# Patient Record
Sex: Male | Born: 1995 | Race: White | Hispanic: No | Marital: Single | Smoking: Former smoker
Health system: Southern US, Community
[De-identification: ages and names within clinical notes are randomized; demographics above are authoritative.]

## PROBLEM LIST (undated history)

## (undated) DIAGNOSIS — F909 Attention-deficit hyperactivity disorder, unspecified type: Secondary | ICD-10-CM

## (undated) DIAGNOSIS — E162 Hypoglycemia, unspecified: Secondary | ICD-10-CM

## (undated) DIAGNOSIS — E860 Dehydration: Secondary | ICD-10-CM

## (undated) DIAGNOSIS — T8859XA Other complications of anesthesia, initial encounter: Secondary | ICD-10-CM

## (undated) DIAGNOSIS — E049 Nontoxic goiter, unspecified: Secondary | ICD-10-CM

## (undated) DIAGNOSIS — R519 Headache, unspecified: Secondary | ICD-10-CM

## (undated) DIAGNOSIS — E109 Type 1 diabetes mellitus without complications: Secondary | ICD-10-CM

## (undated) HISTORY — DX: Type 1 diabetes mellitus without complications: E10.9

## (undated) HISTORY — DX: Hypoglycemia, unspecified: E16.2

## (undated) HISTORY — PX: WRIST FRACTURE SURGERY: SHX121

## (undated) HISTORY — DX: Dehydration: E86.0

## (undated) HISTORY — DX: Nontoxic goiter, unspecified: E04.9

## (undated) HISTORY — PX: TYMPANOSTOMY TUBE PLACEMENT: SHX32

---
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
## 2016-03-08 ENCOUNTER — Encounter: Payer: Self-pay | Admitting: Internal Medicine

## 2016-04-09 ENCOUNTER — Encounter: Payer: Self-pay | Admitting: Endocrinology

## 2016-04-09 ENCOUNTER — Ambulatory Visit (INDEPENDENT_AMBULATORY_CARE_PROVIDER_SITE_OTHER): Payer: BLUE CROSS/BLUE SHIELD | Admitting: Endocrinology

## 2016-04-09 VITALS — BP 124/74 | HR 63 | Wt 135.6 lb

## 2016-04-09 DIAGNOSIS — E108 Type 1 diabetes mellitus with unspecified complications: Secondary | ICD-10-CM

## 2016-04-09 LAB — POCT GLYCOSYLATED HEMOGLOBIN (HGB A1C): HEMOGLOBIN A1C: 13.7

## 2016-04-09 MED ORDER — INSULIN GLARGINE 100 UNIT/ML SOLOSTAR PEN: 24.0000 [IU] | PEN_INJECTOR | Freq: Every day | SUBCUTANEOUS | 99 refills | Status: DC

## 2016-04-09 MED ORDER — INSULIN ASPART 100 UNIT/ML FLEXPEN: PEN_INJECTOR | SUBCUTANEOUS | 11 refills | Status: DC

## 2016-04-09 NOTE — Patient Instructions (Addendum)
good diet and exercise significantly improve the control of your diabetes.  please let me know if you wish to be referred to a dietician.  high blood sugar is very risky to your health.  you should see an eye doctor and dentist every year.  It is very important to get all recommended vaccinations.  Controlling your blood pressure and cholesterol drastically reduces the damage diabetes does to your body.  Those who smoke should quit.  Please discuss these with your doctor.  check your blood sugar 4 times a day: before the 3 meals, and at bedtime.  also check if you have symptoms of your blood sugar being too high or too low.  please keep a record of the readings and bring it to your next appointment here (or you can bring the meter itself).  You can write it on any piece of paper.  please call us sooner if your blood sugar goes below 70, or if you have a lot of readings over 200.  For now, please: Increase the lantus to 24 units daily, and: Decrease the novolog to 1 unit for each 20 grams (except 1 unit for each 30 grams at lunch), and: Continue 1 unit for each 50 over 150 that your blood sugar is. Please come back for a follow-up appointment in 6 weeks.

## 2016-04-09 NOTE — Progress Notes (Signed)
Subjective:    Patient ID: Phillip [name], male    DOB: 03/14/1996, 20 y.o.   MRN: [id number]  HPI pt is referred by Dr Dario GuardianPudlo, for diabetes.  Pt states DM was dx'ed in 2013; he has mild if any neuropathy of the lower extremities; he is unaware of any associated chronic complications; he has been on insulin since dx; pt says his diet and exercise are good; he has never had pancreatitis or severe hypoglycemia.  He has had only 1 episode of DKA (at time of dx).  He took pump rx from 2014-2017 (he stopped due to "scar tissue" at the infusion sites).  He now takes multiple daily injections: lantus 17 units qhs, and qac novolog (7-10 units/meal: 1 unit/15 grams CHO, and 1 unit for for each 50 by which cbg exceeds 150).  He says he misses approx 1 dose qod.  He says cbg's vary from 140-230.  It is highest at hs, and lowest in the afternoon.  He has glucagon, but he has never had to use.   Past Medical History:  Diagnosis Date  . ADHD (attention deficit hyperactivity disorder)   . Adjustment reaction   . Dehydration   . Diabetes mellitus type I (HCC)   . Goiter   . Hypoglycemia     Past Surgical History:  Procedure Laterality Date  . TYMPANOSTOMY TUBE PLACEMENT    . WRIST FRACTURE SURGERY      Social History   Social History  . Marital status: Single    Spouse name: N/A  . Number of children: N/A  . Years of education: N/A   Occupational History  . Not on file.   Social History Main Topics  . Smoking status: Former Smoker    Start date: 05/10/2014    Quit date: 10/09/2015  . Smokeless tobacco: Never Used  . Alcohol use 1.8 oz/week    3 Cans of beer per week     Comment: social  . Drug use:     Frequency: 1.0 time per week    Types: Marijuana     Comment: social  . Sexual activity: No   Other Topics Concern  . Not on file   Social History Narrative  . No narrative on file    Current Outpatient Prescriptions on File Prior to Visit  Medication Sig Dispense Refill  .  cetirizine (ZYRTEC) 10 MG tablet Take 10 mg by mouth daily. Reported on 07/27/2015    . FLUoxetine (PROZAC) 10 MG capsule Take 3 capsules (30 mg total) by mouth daily. 90 capsule 1  . ibuprofen (ADVIL,MOTRIN) 200 MG tablet Take 200 mg by mouth every 6 (six) hours as needed for pain. Reported on 07/27/2015    . mupirocin ointment (BACTROBAN) 2 % Apply to rash twice daily. Dispense 30 gms. (Patient not taking: Reported on 04/09/2016) 22 g 3   No current facility-administered medications on file prior to visit.     No Known Allergies  Family History  Problem Relation Age of Onset  . Diabetes Mother     T2DM  . Hypertension Mother   . Diabetes Maternal Grandmother     T2DM  . Hypertension Maternal Grandmother   . Heart disease Maternal Grandmother   . Heart disease Maternal Grandfather   . Cancer Paternal Grandmother   . Diabetes Other     Granduncle has T1 DM.  Marland Kitchen. Thyroid disease Neg Hx   . Obesity Neg Hx   . Kidney disease Neg Hx   .  Anemia Neg Hx     BP 124/74   Pulse 63   Wt 135 lb 9.6 oz (61.5 kg)   SpO2 99%    Review of Systems denies weight loss, blurry vision, headache, chest pain, sob, n/v, urinary frequency, muscle cramps, excessive diaphoresis, cold intolerance, rhinorrhea, and easy bruising.  Depression is much better.      Objective:   Physical Exam VS: see vs page GEN: no distress HEAD: head: no deformity eyes: no periorbital swelling, no proptosis external nose and ears are normal mouth: no lesion seen NECK: supple, thyroid is not enlarged CHEST WALL: no deformity LUNGS: clear to auscultation CV: reg rate and rhythm, no murmur ABD: abdomen is soft, nontender.  no hepatosplenomegaly.  not distended.  no hernia MUSCULOSKELETAL: muscle bulk and strength are grossly normal.  no obvious joint swelling.  gait is normal and steady EXTEMITIES: no deformity.  no ulcer on the feet.  feet are of normal color and temp.  no edema PULSES: dorsalis pedis intact bilat.  no  carotid bruit NEURO:  cn 2-12 grossly intact.   readily moves all 4's.  sensation is intact to touch on the feet. SKIN:  Normal texture and temperature.  No rash or suspicious lesion is visible.   NODES:  None palpable at the neck.   PSYCH: alert, well-oriented.  Does not appear anxious nor depressed.    Lab Results  Component Value Date   HGBA1C 13.7 04/09/2016   I have reviewed outside records, and summarized: Pt was seen at [facility].  He was noted to be having functional issues with the care of his DM.      Assessment & Plan:  Type 1 DM, new to me, severe exacerbation. Depression: although this is better, he is struggling with functional issues with DM care.  Therefore, we'll simplify regimen by emphasizing lantus for now.   Scarring at [facility] infusion sites.  By hx, this is precluding resumption of pump rx.    Patient is advised the following: Patient Instructions  good diet and exercise significantly improve the control of your diabetes.  please let me know if you wish to be referred to a dietician.  high blood sugar is very risky to your health.  you should see an eye doctor and dentist every year.  It is very important to get all recommended vaccinations.  Controlling your blood pressure and cholesterol drastically reduces the damage diabetes does to your body.  Those who smoke should quit.  Please discuss these with your doctor.  check your blood sugar 4 times a day: before the 3 meals, and at bedtime.  also check if you have symptoms of your blood sugar being too high or too low.  please keep a record of the readings and bring it to your next appointment here (or you can bring the meter itself).  You can write it on any piece of paper.  please call us sooner if your blood sugar goes below 70, or if you have a lot of readings over 200.  For now, please: Increase the lantus to 24 units daily, and: Decrease the novolog to 1 unit for each 20 grams (except 1 unit for each 30  grams at lunch), and: Continue 1 unit for each 50 over 150 that your blood sugar is. Please come back for a follow-up appointment in 6 weeks.

## 2016-04-19 ENCOUNTER — Ambulatory Visit: Payer: Self-pay | Admitting: Endocrinology

## 2016-05-21 NOTE — Progress Notes (Signed)
Subjective:    Patient ID: Phillip [name], male    DOB: 11-29-1995, 21 y.o.   MRN: [id number]  HPI Pt returns for f/u of diabetes mellitus: DM type: 1 Dx'ed: 2013 Complications: none Therapy: insulin since dx DKA: only once (at time of dx) Severe hypoglycemia: never.  Pancreatitis: never Other: he took pump rx from 2014-2017 (he stopped due to "scar tissue" at the infusion sites); he now takes multiple daily injections; depression is limiting rx of DM.  Interval history: no cbg record, but states cbg's vary from 70-300.  There is no trend throughout the day.  He averages approx 35 total units of novolog per day.  He says he misses approx 3 doses of novolog per week (only when he misses a meal).  He does not miss the lantus.   Past Medical History:  Diagnosis Date  . ADHD (attention deficit hyperactivity disorder)   . Adjustment reaction   . Dehydration   . Diabetes mellitus type I (HCC)   . Goiter   . Hypoglycemia     Past Surgical History:  Procedure Laterality Date  . TYMPANOSTOMY TUBE PLACEMENT    . WRIST FRACTURE SURGERY      Social History   Social History  . Marital status: Single    Spouse name: N/A  . Number of children: N/A  . Years of education: N/A   Occupational History  . Not on file.   Social History Main Topics  . Smoking status: Former Smoker    Start date: 05/10/2014    Quit date: 10/09/2015  . Smokeless tobacco: Never Used  . Alcohol use 1.8 oz/week    3 Cans of beer per week     Comment: social  . Drug use:     Frequency: 1.0 time per week    Types: Marijuana     Comment: social  . Sexual activity: No   Other Topics Concern  . Not on file   Social History Narrative  . No narrative on file    Current Outpatient Prescriptions on File Prior to Visit  Medication Sig Dispense Refill  . cetirizine (ZYRTEC) 10 MG tablet Take 10 mg by mouth daily. Reported on 07/27/2015    . ibuprofen (ADVIL,MOTRIN) 200 MG tablet Take 200 mg by mouth every 6  (six) hours as needed for pain. Reported on 07/27/2015    . insulin aspart (NOVOLOG FLEXPEN) 100 UNIT/ML FlexPen Per scale, for a total of 35 units per day, and pen needles 4/day 15 mL 11  . mupirocin ointment (BACTROBAN) 2 % Apply to rash twice daily. Dispense 30 gms. 22 g 3  . FLUoxetine (PROZAC) 10 MG capsule Take 3 capsules (30 mg total) by mouth daily. 90 capsule 1   No current facility-administered medications on file prior to visit.     No Known Allergies  Family History  Problem Relation Age of Onset  . Diabetes Mother     T2DM  . Hypertension Mother   . Diabetes Maternal Grandmother     T2DM  . Hypertension Maternal Grandmother   . Heart disease Maternal Grandmother   . Heart disease Maternal Grandfather   . Cancer Paternal Grandmother   . Diabetes Other     Granduncle has T1 DM.  Marland Kitchen Thyroid disease Neg Hx   . Obesity Neg Hx   . Kidney disease Neg Hx   . Anemia Neg Hx     BP 106/78   Pulse 81   Ht 5' 9.5" (1.765  m)   Wt 134 lb (60.8 kg)   SpO2 97%   BMI 19.50 kg/m    Review of Systems He denies hypoglycemia.     Objective:   Physical Exam VITAL SIGNS:  See vs page GENERAL: no distress Pulses: dorsalis pedis intact bilat.   MSK: no deformity of the feet CV: no leg edema Skin:  no ulcer on the feet.  normal color and temp on the feet. Neuro: sensation is intact to touch on the feet.       Assessment & Plan:  Type 1 DM: he needs increased rx.  Patient is advised the following: Patient Instructions  check your blood sugar 4 times a day: before the 3 meals, and at bedtime.  also check if you have symptoms of your blood sugar being too high or too low.  please keep a record of the readings and bring it to your next appointment here (or you can bring the meter itself).  You can write it on any piece of paper.  please call us sooner if your blood sugar goes below 70, or if you have a lot of readings over 200.  For now, please: Increase the lantus to 28 units  daily, and: continue the novolog: 1 unit for each 20 grams (except 1 unit for each 30 grams at lunch), and: Continue 1 unit for each 50 over 150 that your blood sugar is.  Please come back for a follow-up appointment in 6 weeks.

## 2016-05-22 ENCOUNTER — Encounter: Payer: Self-pay | Admitting: Endocrinology

## 2016-05-22 ENCOUNTER — Ambulatory Visit (INDEPENDENT_AMBULATORY_CARE_PROVIDER_SITE_OTHER): Payer: Self-pay | Admitting: Endocrinology

## 2016-05-22 VITALS — BP 106/78 | HR 81 | Ht 69.5 in | Wt 134.0 lb

## 2016-05-22 DIAGNOSIS — E109 Type 1 diabetes mellitus without complications: Secondary | ICD-10-CM

## 2016-05-22 MED ORDER — INSULIN GLARGINE 100 UNIT/ML SOLOSTAR PEN: 28.0000 [IU] | PEN_INJECTOR | Freq: Every day | SUBCUTANEOUS | 99 refills | Status: DC

## 2016-05-22 NOTE — Patient Instructions (Addendum)
check your blood sugar 4 times a day: before the 3 meals, and at bedtime.  also check if you have symptoms of your blood sugar being too high or too low.  please keep a record of the readings and bring it to your next appointment here (or you can bring the meter itself).  You can write it on any piece of paper.  please call us sooner if your blood sugar goes below 70, or if you have a lot of readings over 200.  For now, please: Increase the lantus to 28 units daily, and: continue the novolog: 1 unit for each 20 grams (except 1 unit for each 30 grams at lunch), and: Continue 1 unit for each 50 over 150 that your blood sugar is.  Please come back for a follow-up appointment in 6 weeks.

## 2016-05-28 ENCOUNTER — Telehealth: Payer: Self-pay | Admitting: Endocrinology

## 2016-05-28 NOTE — Telephone Encounter (Signed)
I contacted the patient and left a voicemail requesting a call back from the patient to verify if he needed the pump supply paper work completed. At office visit on 05/22/2016 patient was not on insulin pump.

## 2016-05-28 NOTE — Telephone Encounter (Signed)
[facility] called and was following up on a request for the patients pump supplies.

## 2016-06-28 NOTE — Telephone Encounter (Signed)
Open in error

## 2016-07-03 ENCOUNTER — Ambulatory Visit (INDEPENDENT_AMBULATORY_CARE_PROVIDER_SITE_OTHER): Payer: Self-pay | Admitting: Endocrinology

## 2016-07-03 ENCOUNTER — Encounter: Payer: Self-pay | Admitting: Endocrinology

## 2016-07-03 VITALS — BP 116/70 | HR 64 | Ht 69.5 in | Wt 134.0 lb

## 2016-07-03 DIAGNOSIS — E109 Type 1 diabetes mellitus without complications: Secondary | ICD-10-CM

## 2016-07-03 LAB — POCT GLYCOSYLATED HEMOGLOBIN (HGB A1C): HEMOGLOBIN A1C: 13

## 2016-07-03 MED ORDER — INSULIN GLARGINE 100 UNIT/ML SOLOSTAR PEN: 45.0000 [IU] | PEN_INJECTOR | Freq: Every day | SUBCUTANEOUS | 99 refills | Status: DC

## 2016-07-03 MED ORDER — INSULIN ASPART 100 UNIT/ML FLEXPEN: 5.0000 [IU] | PEN_INJECTOR | Freq: Three times a day (TID) | SUBCUTANEOUS | 11 refills | Status: DC

## 2016-07-03 NOTE — Progress Notes (Signed)
Subjective:    Patient ID: Phillip [name], male    DOB: February 12, 1996, 21 y.o.   MRN: [id number]  HPI Pt returns for f/u of diabetes mellitus: DM type: 1 Dx'ed: 2013 Complications: none Therapy: insulin since dx DKA: only once (at time of dx) Severe hypoglycemia: never.  Pancreatitis: never Other: he took pump rx from 2014-2017 (he stopped due to "scar tissue" at the infusion sites); he now takes multiple daily injections; depression is limiting rx of DM.   Interval history: he averages approx 10 units of novolog per day, but he says he still often misses the novolog.  no cbg record, but states cbg's are often over 200.  pt states he feels well in general.  He wants to resume pump rx.  Past Medical History:  Diagnosis Date  . ADHD (attention deficit hyperactivity disorder)   . Adjustment reaction   . Dehydration   . Diabetes mellitus type I ([facility])   . Goiter   . Hypoglycemia     Past Surgical History:  Procedure Laterality Date  . TYMPANOSTOMY TUBE PLACEMENT    . WRIST FRACTURE SURGERY      Social History   Social History  . Marital status: Single    Spouse name: N/A  . Number of children: N/A  . Years of education: N/A   Occupational History  . Not on file.   Social History Main Topics  . Smoking status: Former Smoker    Start date: 05/10/2014    Quit date: 10/09/2015  . Smokeless tobacco: Never Used  . Alcohol use 1.8 oz/week    3 Cans of beer per week     Comment: social  . Drug use: Yes    Frequency: 1.0 time per week    Types: Marijuana     Comment: social  . Sexual activity: No   Other Topics Concern  . Not on file   Social History Narrative  . No narrative on file    Current Outpatient Prescriptions on File Prior to Visit  Medication Sig Dispense Refill  . cetirizine (ZYRTEC) 10 MG tablet Take 10 mg by mouth daily. Reported on 07/27/2015    . ibuprofen (ADVIL,MOTRIN) 200 MG tablet Take 200 mg by mouth every 6 (six) hours as needed for pain. Reported  on 07/27/2015    . mupirocin ointment (BACTROBAN) 2 % Apply to rash twice daily. Dispense 30 gms. 22 g 3  . FLUoxetine (PROZAC) 10 MG capsule Take 3 capsules (30 mg total) by mouth daily. 90 capsule 1   No current facility-administered medications on file prior to visit.     No Known Allergies  Family History  Problem Relation Age of Onset  . Diabetes Mother     T2DM  . Hypertension Mother   . Diabetes Maternal Grandmother     T2DM  . Hypertension Maternal Grandmother   . Heart disease Maternal Grandmother   . Heart disease Maternal Grandfather   . Cancer Paternal Grandmother   . Diabetes Other     Granduncle has T1 DM.  Marland Kitchen Thyroid disease Neg Hx   . Obesity Neg Hx   . Kidney disease Neg Hx   . Anemia Neg Hx     BP 116/70   Pulse 64   Ht 5' 9.5" (1.765 m)   Wt 134 lb (60.8 kg)   SpO2 98%   BMI 19.50 kg/m    Review of Systems He denies hypoglycemia.     Objective:   Physical Exam  VITAL SIGNS:  See vs page GENERAL: no distress Pulses: dorsalis pedis intact bilat.   MSK: no deformity of the feet CV: no leg edema Skin:  no ulcer on the feet.  normal color and temp on the feet.   Neuro: sensation is intact to touch on the feet.    A1c=13%    Assessment & Plan:  Type 1 DM: very poor control.  When he resumes pump rx, he'll need a simple regimen, with no mealtime boluses.  Patient is advised the following: Patient Instructions  check your blood sugar 4 times a day: before the 3 meals, and at bedtime.  also check if you have symptoms of your blood sugar being too high or too low.  please keep a record of the readings and bring it to your next appointment here (or you can bring the meter itself).  You can write it on any piece of paper.  please call us sooner if your blood sugar goes below 70, or if you have a lot of readings over 200.  For now, please: Increase the lantus to 45 units daily, and:  Take novolog 5 units 3 times a day (just before each meal).  Please  come back for a follow-up appointment in 2 weeks.   Please see Bonita QuinLinda, to resume your pump.

## 2016-07-03 NOTE — Patient Instructions (Addendum)
check your blood sugar 4 times a day: before the 3 meals, and at bedtime.  also check if you have symptoms of your blood sugar being too high or too low.  please keep a record of the readings and bring it to your next appointment here (or you can bring the meter itself).  You can write it on any piece of paper.  please call us sooner if your blood sugar goes below 70, or if you have a lot of readings over 200.  For now, please: Increase the lantus to 45 units daily, and:  Take novolog 5 units 3 times a day (just before each meal).  Please come back for a follow-up appointment in 2 weeks.   Please see Bonita QuinLinda, to resume your pump.

## 2016-07-31 ENCOUNTER — Encounter: Payer: BLUE CROSS/BLUE SHIELD | Admitting: Nutrition

## 2016-07-31 ENCOUNTER — Ambulatory Visit: Payer: BLUE CROSS/BLUE SHIELD | Admitting: Endocrinology

## 2016-08-01 ENCOUNTER — Telehealth: Payer: Self-pay | Admitting: Endocrinology

## 2016-08-01 NOTE — Telephone Encounter (Signed)
Patient no showed today's appt. Please advise on how to follow up. °A. No follow up necessary. °B. Follow up urgent. Contact patient immediately. °C. Follow up necessary. Contact patient and schedule visit in ___ days. °D. Follow up advised. Contact patient and schedule visit in ____weeks. ° °

## 2016-08-01 NOTE — Telephone Encounter (Signed)
Please come back for a follow-up appointment in 6 weeks  

## 2016-08-08 NOTE — Telephone Encounter (Signed)
Message was left for Pt to reschedule.

## 2016-10-24 ENCOUNTER — Ambulatory Visit (INDEPENDENT_AMBULATORY_CARE_PROVIDER_SITE_OTHER): Payer: BLUE CROSS/BLUE SHIELD | Admitting: Family

## 2016-12-26 ENCOUNTER — Telehealth: Payer: Self-pay | Admitting: Nutrition

## 2016-12-26 NOTE — Telephone Encounter (Signed)
Message left to call me to schedule a pump appt.  Telephone number given

## 2017-06-18 ENCOUNTER — Ambulatory Visit: Payer: BLUE CROSS/BLUE SHIELD | Admitting: Internal Medicine

## 2017-06-18 ENCOUNTER — Other Ambulatory Visit: Payer: Self-pay

## 2017-06-18 VITALS — Ht 69.5 in | Wt 125.0 lb

## 2017-06-18 DIAGNOSIS — E1065 Type 1 diabetes mellitus with hyperglycemia: Secondary | ICD-10-CM | POA: Diagnosis not present

## 2017-06-18 LAB — POCT GLYCOSYLATED HEMOGLOBIN (HGB A1C): Hemoglobin A1C: >15

## 2017-06-18 LAB — LIPID PANEL
Cholesterol: 434 mg/dL — ABNORMAL HIGH (ref 0–200)
HDL: 31.5 mg/dL — AB (ref >39.00)
Total CHOL/HDL Ratio: 14

## 2017-06-18 LAB — LDL CHOLESTEROL, DIRECT: Direct LDL: 105 mg/dL

## 2017-06-18 LAB — TSH: TSH: 0.96 u[IU]/mL (ref 0.35–5.50)

## 2017-06-18 MED ORDER — INSULIN ASPART 100 UNIT/ML FLEXPEN: PEN_INJECTOR | SUBCUTANEOUS | 3 refills | Status: DC

## 2017-06-18 MED ORDER — INSULIN DEGLUDEC 200 UNIT/ML ~~LOC~~ SOPN: 20.0000 [IU] | PEN_INJECTOR | Freq: Every day | SUBCUTANEOUS | 5 refills | Status: DC

## 2017-06-18 NOTE — Progress Notes (Signed)
Patient ID: Phillip [name], male   DOB: 1995/08/15, 22 y.o.   MRN: [id number]   HPI: Phillip [name] is a 22 y.o.-year-old male, referred by his PCP, Dr. Aurther Loft, for management of DM1, uncontrolled, with complications (PN).  Patient has been diagnosed with diabetes in 2013; he started on insulin at dx. Reviewed pediatric endocrinology note: "The patient was admitted to the Pediatric Ward at [facility] on 06/05/11 for the chief complaint of new-onset type 1 diabetes mellitus, dehydration, and weight loss. His initial CBG was 389. Serum sodium was 132, CO2 26, and glucose 394. His initial hemoglobin A1c was 12.1%. His initial C-peptide was 0.31 (normal 0.89-3.80). TSH was 1.531. Free T4 was 1.07. Urine ketones were 40. Anti-islet cell antibody was elevated at 10 (normal < 5). Anti-insulin antibody and anti-GAD antibody were negative."  Last hemoglobin A1c was: Lab Results  Component Value Date   HGBA1C 13.0 07/03/2016   HGBA1C 13.7 04/09/2016   HGBA1C 11.8 06/22/2015   Pt is not on an insulin pump now but was on a Medtronic 530 G pump 2014-2017 >> developed scar tissue at the infusion sites. Hee is interested to restart on an insulin pump.  He is on: - Lantus 45 >> 16 units at bedtime - NovoLog: - ICR: 1:15 - ISF 1:50 - target 250 (!)  Meter: Bayer Contour  Pt checks his sugars 3x a day and they are: - am: 280s - 2h after b'fast: n/c - before lunch: 220-240 - 2h after lunch: n/c - before dinner: n/c - 2h after dinner: 280-350 - bedtime: n/c - nighttime: n/c Lowest sugar was 34 (miscalculated insulin dose) - long time agi; he has hypoglycemia awareness at 70. No previous hypoglycemia admission. He does have a glucagon kit at home. Highest sugar was 480. + previous DKA admission - 1x, at dx.    Pt'- last set of lipids: Lab Results  Component Value Date   CHOL 141 12/22/2013   HDL 54 12/22/2013   LDLCALC 73 12/22/2013   TRIG 70 12/22/2013   CHOLHDL 2.6  12/22/2013   - last eye exam was in 2018. No DR.  - no numbness and tingling in his feet.  Last TSH: Lab Results  Component Value Date   TSH 2.246 12/22/2013   Pt has FH of DM in father's uncle - type 1, other members with DM2.   ROS: Constitutional: + weight loss, no fatigue, no subjective hyperthermia/hypothermia Eyes: no blurry vision, no xerophthalmia ENT: no sore throat, no nodules palpated in throat, no dysphagia/odynophagia, no hoarseness Cardiovascular: no CP/SOB/palpitations/leg swelling Respiratory: no cough/SOB Gastrointestinal: no N/V/D/C Musculoskeletal: no muscle/joint aches Skin: no rashes Neurological: no tremors/+ numbness/+ tingling/no dizziness Psychiatric: no depression/anxiety  Past Medical History:  Diagnosis Date  . ADHD (attention deficit hyperactivity disorder)   . Adjustment reaction   . Dehydration   . Diabetes mellitus type I ([place])   . Goiter   . Hypoglycemia    Past Surgical History:  Procedure Laterality Date  . TYMPANOSTOMY TUBE PLACEMENT    . WRIST FRACTURE SURGERY     Social History   Socioeconomic History  . Marital status: Single    Spouse name: Not on file  . Number of children: Not on file  . Years of education: Not on file  . Highest education level: Not on file  Social Needs  . Financial resource strain: Not on file  . Food insecurity - worry: Not on file  . Food insecurity - inability:  Not on file  . Transportation needs - medical: Not on file  . Transportation needs - non-medical: Not on file  Occupational History  . Not on file  Tobacco Use  . Smoking status: Former Smoker    Start date: 05/10/2014    Last attempt to quit: 10/09/2015    Years since quitting: 1.6  . Smokeless tobacco: Never Used  Substance and Sexual Activity  . Alcohol use: Yes    Alcohol/week: 1.8 oz    Types: 3 Cans of beer per week    Comment: social  . Drug use: Yes    Frequency: 1.0 times per week    Types: Marijuana    Comment: social  .  Sexual activity: No  Other Topics Concern  . Not on file  Social History Narrative  . Not on file   Current Outpatient Medications on File Prior to Visit  Medication Sig Dispense Refill  . cetirizine (ZYRTEC) 10 MG tablet Take 10 mg by mouth daily. Reported on 07/27/2015    . ibuprofen (ADVIL,MOTRIN) 200 MG tablet Take 200 mg by mouth every 6 (six) hours as needed for pain. Reported on 07/27/2015    . Insulin Glargine (LANTUS SOLOSTAR) 100 UNIT/ML Solostar Pen Inject 45 Units into the skin daily. 5 pen PRN  . mupirocin ointment (BACTROBAN) 2 % Apply to rash twice daily. Dispense 30 gms. 22 g 3  . FLUoxetine (PROZAC) 10 MG capsule Take 3 capsules (30 mg total) by mouth daily. 90 capsule 1   No current facility-administered medications on file prior to visit.    No Known Allergies Family History  Problem Relation Age of Onset  . Diabetes Mother        T2DM  . Hypertension Mother   . Diabetes Maternal Grandmother        T2DM  . Hypertension Maternal Grandmother   . Heart disease Maternal Grandmother   . Heart disease Maternal Grandfather   . Cancer Paternal Grandmother   . Diabetes Other        Granduncle has T1 DM.  Marland Kitchen Thyroid disease Neg Hx   . Obesity Neg Hx   . Kidney disease Neg Hx   . Anemia Neg Hx     PE: Ht 5' 9.5" (1.765 m)   Wt 125 lb (56.7 kg)   BMI 18.19 kg/m  Wt Readings from Last 3 Encounters:  06/18/17 125 lb (56.7 kg)  07/03/16 134 lb (60.8 kg) (16 %, Z= -0.99)*  05/22/16 134 lb (60.8 kg) (16 %, Z= -0.97)*   * Growth percentiles are based on CDC (Boys, 2-20 Years) data.   Constitutional: thin, in NAD Eyes: PERRLA, EOMI, no exophthalmos ENT: moist mucous membranes, no thyromegaly, no cervical lymphadenopathy Cardiovascular: RRR, No MRG Respiratory: CTA B Gastrointestinal: abdomen soft, NT, ND, BS+ Musculoskeletal: no deformities, strength intact in all 4 Skin: moist, warm, no rashes Neurological: no tremor with outstretched hands, DTR normal in all  4  ASSESSMENT: 1. DM1, uncontrolled, without long term complications, but with hyperglycemia  PLAN:  1. Patient with long-standing, uncontrolled DM1, previously on insulin pump therapy, now on basal-bolus insulin regimen, but desiring to restart his pump.  He has with him a Medtronic 670 G insulin pump which he recently received; however, he is aware about the fact that he could not use the pump past because of not having enough fat on his body.  We decided for now to stay with insulin pens until we bring his sugars a little more under  control before retrying the pump.  He and his stepmother agree with the plan. - His sugars remain high throughout the day, in the 200-300 range.  He is very glucose toxic and this can cause increased insulin resistance.  We discussed about increasing his basal insulin at this point and try to switch to [place] U200, as this is a longer-acting insulin for low blood sugars.  Also, I recommended to switch to the more concentrated form for better absorption.  If he cannot obtain to [place] (I did give him a coupon card), will need to remain on Lantus. In the meantime, I suggested to continue his insulin to carb ratio and insulin sensitivity factors, however, I am not sure why he was using a target of 250...  I advised him to decrease this to 130.  He agrees with these changes. - We discussed about changes to his insulin regimen, as follows:  Patient Instructions  Please change: - Lantus/Tresiba to 20 units at bedtime (in 4-5 days, if sugars not improved, increase to 24 units) - Novolog: ICR 1:15 ISF 1:50 Target 250 >> 130  Please return in 1.5 months with your sugar log.   - Strongly advised him to start checking sugars at different times of the day - check at least 4 times a day, rotating checks.  Discussed about the freestyle libre CGM, but will wait to see if again attached the pump before deciding for his CGM. - given sugar log and advised how to fill it and to  bring it at next appt  - given foot care handout and explained the principles  - given instructions for hypoglycemia management "15-15 rule"  - advised for yearly eye exams - advised to get ketone strips - advised to always have Glu tablets with him - advised for a Med-alert bracelet mentioning "type 1 diabetes mellitus". - At next visit, he may need to see DM education for further help with the pump: carb counting check, basal rate validation, extended bolusing, sick days rules, etc. - given instruction Re: exercising and driving in DM1 (pt instructions) - no signs of other autoimmune disorders, but will check a TSH at this visit, along with the rest of his annual labs - Return to clinic in 1.5 mo with sugar log   - time spent with the patient and his stepmother: 40 min, of which >50% was spent in reviewing his pump and CGM downloads, discussing his hypo- and hyper-glycemic episodes, reviewing previous labs and pump settings and developing a plan to avoid hypo- and hyper-glycemia.   Office Visit on 06/18/2017  Component Date Value Ref Range Status  . Hemoglobin A1C 06/18/2017 >15   Final  . Glucose, Bld 06/18/2017 505* 65 - 99 mg/dL Final   Comment: Verified by repeat analysis. Marland Kitchen .            Fasting reference interval . For someone without known diabetes, a glucose value >125 mg/dL indicates that they may have diabetes and this should be confirmed with a follow-up test. .   . BUN 06/18/2017 11  7 - 25 mg/dL Final  . Creat 06/18/2017 0.93  0.60 - 1.35 mg/dL Final  . GFR, Est Non African American 06/18/2017 118  > OR = 60 mL/min/1.73m Final  . GFR, Est African American 06/18/2017 136  > OR = 60 mL/min/1.711mFinal  . BUN/Creatinine Ratio 0266/06/0045OT APPLICABLE  6 - 22 (calc) Final  . Sodium 06/18/2017 128* 135 - 146 mmol/L Final  . Potassium  06/18/2017 4.3  3.5 - 5.3 mmol/L Final  . Chloride 06/18/2017 95* 98 - 110 mmol/L Final  . CO2 06/18/2017 19* 20 - 32 mmol/L Final  .  Calcium 06/18/2017 9.7  8.6 - 10.3 mg/dL Final  . Total Protein 06/18/2017 7.7  6.1 - 8.1 g/dL Final  . Albumin 06/18/2017 4.8  3.6 - 5.1 g/dL Final  . Globulin 06/18/2017 2.9  1.9 - 3.7 g/dL (calc) Final  . AG Ratio 06/18/2017 1.7  1.0 - 2.5 (calc) Final  . Total Bilirubin 06/18/2017 0.7  0.2 - 1.2 mg/dL Final  . Alkaline phosphatase (APISO) 06/18/2017 96  40 - 115 U/L Final  . AST 06/18/2017 12  10 - 40 U/L Final  . ALT 06/18/2017 12  9 - 46 U/L Final  . Cholesterol 06/18/2017 434* 0 - 200 mg/dL Final   ATP III Classification       Desirable:  < 200 mg/dL               Borderline High:  200 - 239 mg/dL          High:  > = 240 mg/dL  . Triglycerides 06/18/2017 1469.0 Triglyceride is over 400; calculations on Lipids are invalid.* 0.0 - 149.0 mg/dL Final   Normal:  <150 mg/dLBorderline High:  150 - 199 mg/dL  . HDL 06/18/2017 31.50* >39.00 mg/dL Final  . Total CHOL/HDL Ratio 06/18/2017 14   Final                  Men          Women1/2 Average Risk     3.4          3.3Average Risk          5.0          4.42X Average Risk          9.6          7.13X Average Risk          15.0          11.0                      . TSH 06/18/2017 0.96  0.35 - 5.50 uIU/mL Final  . Direct LDL 06/18/2017 105.0  mg/dL Final   Optimal:  <100 mg/dLNear or Above Optimal:  100-129 mg/dLBorderline High:  130-159 mg/dLHigh:  160-189 mg/dLVery High:  >190 mg/dL   Glucose high, in the 500s.  Secondary hyponatremia. 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 Will repeat lipid panel at next visit.  Philemon Kingdom, MD PhD [facility] Endocrinology

## 2017-06-18 NOTE — Patient Instructions (Addendum)
Please change: - Lantus/Tresiba to 20 units at bedtime (in 4-5 days, if sugars not improved, increase to 24 units) - Novolog: ICR 1:15 ISF 1:50 Target 250 >> 130  Please return in 1.5 months with your sugar log.   Basic Rules for Patients with Type I Diabetes Mellitus  1. The American Diabetes Association (ADA) recommended targets: - fasting sugar <130 - after meal sugar <180 - HbA1C <7%  2. Engage in ?150 min moderate exercise per week  3. Make sure you have ?8h of sleep every night as this helps both blood sugars and your weight.  4. Always keep a sugar log (not only record in your meter) and bring it to all appointments with us.  5. "15-15 rule" for hypoglycemia: if sugars are low, take 15 g of carbs** ("fast sugar" - e.g. 4 glucose tablets, 4 oz orange juice), wait 15 min, then check sugars again. If still <80, repeat. Continue  until your sugars >80, then eat a normal meal.   6. Teach family members and coworkers to inject glucagon. Have a glucagon set at home and one at work. They should call 911 after using the set.  7. Check sugar before driving. If <100, correct, and only start driving if sugars rise ?161100. Check sugar every hour when on a long drive.  8. Check sugar before exercising. If <100, correct, and only start exercising if sugars rise ?100. Check sugar every hour when on a long exercise routine and 1h after you finished exercising.   If >250, check urine for ketones. If you have moderate-large ketones in urine, do not start exercise. Hydrate yourself with clear liquids and correct the high sugar. Recheck sugars and ketones before attempting to exercise.  Be aware that you might need less insulin when exercising.  *intense, short, exercise bursts can increase your sugars, but  *less intense, longer (>1h), exercise routines can decrease your sugars.   9. Make sure you have a MedAlert bracelet or pendant mentioning "Type I Diabetes Mellitus". If you have a prior  episode of severe hypoglycemia or hypoglycemia unawareness, it should also mention this.  10. Please do not walk barefoot. Inspect your feet for sores/cuts and let us know if you have them.   **E.g. of "fast carbs": ? first choice (15 g):  1 tube glucose gel, GlucoPouch 15, 2 oz glucose liquid ? second choice (15-16 g):  3 or 4 glucose tablets (best taken  with water), 15 Dextrose Bits chewable ? third choice (15-20 g):   cup fruit juice,  cup regular soda, 1 cup skim milk,  1 cup sports drink ? fourth choice (15-20 g):  1 small tube [place] gel (not frosting), 2 tbsp raisins, 1 tbsp table sugar,  candy, jelly beans, gum drops - check package for carb amount   (adapted from: Juluis RainierMcCall A.L. "Insulin therapy and hypoglycemia" Endocrinol Metab Clin N Am 2012, 41: 57-87)

## 2017-06-19 ENCOUNTER — Encounter: Payer: Self-pay | Admitting: Internal Medicine

## 2017-06-19 LAB — COMPLETE METABOLIC PANEL WITH GFR
AG RATIO: 1.7 (calc) (ref 1.0–2.5)
ALT: 12 U/L (ref 9–46)
AST: 12 U/L (ref 10–40)
Albumin: 4.8 g/dL (ref 3.6–5.1)
Alkaline phosphatase (APISO): 96 U/L (ref 40–115)
BILIRUBIN TOTAL: 0.7 mg/dL (ref 0.2–1.2)
BUN: 11 mg/dL (ref 7–25)
CHLORIDE: 95 mmol/L — AB (ref 98–110)
CO2: 19 mmol/L — ABNORMAL LOW (ref 20–32)
Calcium: 9.7 mg/dL (ref 8.6–10.3)
Creat: 0.93 mg/dL (ref 0.60–1.35)
GFR, EST AFRICAN AMERICAN: 136 mL/min/{1.73_m2} (ref >=60)
GFR, Est Non African American: 118 mL/min/{1.73_m2} (ref >=60)
Globulin: 2.9 g/dL (calc) (ref 1.9–3.7)
Glucose, Bld: 505 mg/dL (ref 65–99)
POTASSIUM: 4.3 mmol/L (ref 3.5–5.3)
Sodium: 128 mmol/L — ABNORMAL LOW (ref 135–146)
Total Protein: 7.7 g/dL (ref 6.1–8.1)

## 2017-08-21 ENCOUNTER — Ambulatory Visit: Payer: BLUE CROSS/BLUE SHIELD | Admitting: Internal Medicine

## 2017-08-21 ENCOUNTER — Encounter: Payer: Self-pay | Admitting: Internal Medicine

## 2017-08-21 VITALS — BP 128/72 | HR 89 | Ht 69.5 in | Wt 134.4 lb

## 2017-08-21 DIAGNOSIS — E1065 Type 1 diabetes mellitus with hyperglycemia: Secondary | ICD-10-CM | POA: Diagnosis not present

## 2017-08-21 DIAGNOSIS — E1042 Type 1 diabetes mellitus with diabetic polyneuropathy: Secondary | ICD-10-CM | POA: Diagnosis not present

## 2017-08-21 DIAGNOSIS — E781 Pure hyperglyceridemia: Secondary | ICD-10-CM

## 2017-08-21 DIAGNOSIS — IMO0002 Reserved for concepts with insufficient information to code with codable children: Secondary | ICD-10-CM

## 2017-08-21 NOTE — Patient Instructions (Addendum)
Please continue: - Lantus 20 units at bedtime, but change to [place] when you run out - Novolog: ICR 1:15 ISF 1:50 Target 130  Let me know when you run out of Novolog so I can call in FiAsp.  Please start 1000 mg Fish/flax oil 2x a day.  Please return in 2-3 months with your sugar log.

## 2017-08-21 NOTE — Progress Notes (Signed)
Patient ID: Phillip [name], male   DOB: 1995/06/11, 22 y.o.   MRN: [id number]   HPI: Phillip [name] is a 22 y.o.-year-old male, initially referred by his PCP, Dr. Aurther Loft, for management of DM1, uncontrolled, with complications (PN).  Last visit 2 months ago.  He is here with his step mother who offers part of the history, initially related to his blood sugars at home and his medications.  Patient has been diagnosed with diabetes in 2013; he started on insulin at dx. reviewed pediatric endocrinology note: "The patient was admitted to the Pediatric Ward at [facility] on 06/05/11 for the chief complaint of new-onset type 1 diabetes mellitus, dehydration, and weight loss. His initial CBG was 389. Serum sodium was 132, CO2 26, and glucose 394. His initial hemoglobin A1c was 12.1%. His initial C-peptide was 0.31 (normal 0.89-3.80). TSH was 1.531. Free T4 was 1.07. Urine ketones were 40. Anti-islet cell antibody was elevated at 10 (normal < 5). Anti-insulin antibody and anti-GAD antibody were negative."  Last hemoglobin A1c was: Lab Results  Component Value Date   HGBA1C >15 06/18/2017   HGBA1C 13.0 07/03/2016   HGBA1C 13.7 04/09/2016   Pt is not on an insulin pump now but was on a Medtronic 530 G pump 2014-2017 >> developed scar tissue at the infusion sites.  He is interested to restart an insulin pump.  He is on: - Lantus 45 >> 16 units at bedtime >> 20 units at bedtime Tyler Aas not tried now) - NovoLog: (7-12 units per meal) - ICR: 1:15 - ISF 1:50 - target 250 (!) >> 130  Meter: Bayer Contour  Pt checks his sugars 3 times a day: - am: 280s >> 150-230 - 2h after b'fast: n/c - before lunch: 220-240 >> 68-200, 250 (may take NovoLog later or skips) - 2h after lunch: n/c - before dinner: n/c - 2h after dinner: 280-350 >> 200-300 - bedtime: n/c - nighttime: n/c Lowest sugar was 34 (miscalculated insulin dose) - long time ago >> 68; he has hypoglycemia awareness in the 70s.    No previous hypoglycemia admission.  He does have a glucagon kit at home. Highest sugar was 480 >> 380 x1 (syrup).  He had one previous DKA admission at diagnosis.  We checked his lipid panel at last visit and triglycerides were extremely high: Lab Results  Component Value Date   CHOL 434 (H) 06/18/2017   HDL 31.50 (L) 06/18/2017   LDLCALC 73 12/22/2013   LDLDIRECT 105.0 06/18/2017   TRIG (H) 06/18/2017    1469.0 Triglyceride is over 400; calculations on Lipids are invalid.   CHOLHDL 14 06/18/2017   - last eye exam was in 2018: No DR - Denies numbness and tingling in his feet.  Last TSH was normal: Lab Results  Component Value Date   TSH 0.96 06/18/2017   Pt has FH of DM in father's uncle - type 1, other members with DM2.   ROS: Constitutional: + weight gain/no weight loss, no fatigue, no subjective hyperthermia, no subjective hypothermia Eyes: no blurry vision, no xerophthalmia ENT: no sore throat, no nodules palpated in throat, no dysphagia, no odynophagia, no hoarseness Cardiovascular: no CP/no SOB/no palpitations/no leg swelling Respiratory: no cough/no SOB/no wheezing Gastrointestinal: + mild occas. N/no V/+ D/no C/no acid reflux Musculoskeletal: no muscle aches/no joint aches Skin: no rashes, no hair loss Neurological: no tremors/no numbness/no tingling/no dizziness  I reviewed pt's medications, allergies, PMH, social hx, family hx, and changes were documented in the  history of present illness. Otherwise, unchanged from my initial visit note.  Past Medical History:  Diagnosis Date  . ADHD (attention deficit hyperactivity disorder)   . Adjustment reaction   . Dehydration   . Diabetes mellitus type I ([place])   . Goiter   . Hypoglycemia    Past Surgical History:  Procedure Laterality Date  . TYMPANOSTOMY TUBE PLACEMENT    . WRIST FRACTURE SURGERY     Social History   Socioeconomic History  . Marital status: Single    Spouse name: Not on file  . Number of  children: Not on file  . Years of education: Not on file  . Highest education level: Not on file  Occupational History  . Not on file  Social Needs  . Financial resource strain: Not on file  . Food insecurity:    Worry: Not on file    Inability: Not on file  . Transportation needs:    Medical: Not on file    Non-medical: Not on file  Tobacco Use  . Smoking status: Former Smoker    Start date: 05/10/2014    Last attempt to quit: 10/09/2015    Years since quitting: 1.8  . Smokeless tobacco: Never Used  Substance and Sexual Activity  . Alcohol use: Yes    Alcohol/week: 1.8 oz    Types: 3 Cans of beer per week    Comment: social  . Drug use: Yes    Frequency: 1.0 times per week    Types: Marijuana    Comment: social  . Sexual activity: Never  Lifestyle  . Physical activity:    Days per week: Not on file    Minutes per session: Not on file  . Stress: Not on file  Relationships  . Social connections:    Talks on phone: Not on file    Gets together: Not on file    Attends religious service: Not on file    Active member of club or organization: Not on file    Attends meetings of clubs or organizations: Not on file    Relationship status: Not on file  . Intimate partner violence:    Fear of current or ex partner: Not on file    Emotionally abused: Not on file    Physically abused: Not on file    Forced sexual activity: Not on file  Other Topics Concern  . Not on file  Social History Narrative  . Not on file   Current Outpatient Medications on File Prior to Visit  Medication Sig Dispense Refill  . cetirizine (ZYRTEC) 10 MG tablet Take 10 mg by mouth daily. Reported on 07/27/2015    . FLUoxetine (PROZAC) 10 MG capsule Take 3 capsules (30 mg total) by mouth daily. 90 capsule 1  . ibuprofen (ADVIL,MOTRIN) 200 MG tablet Take 200 mg by mouth every 6 (six) hours as needed for pain. Reported on 07/27/2015    . insulin aspart (NOVOLOG FLEXPEN) 100 UNIT/ML FlexPen Inject 8-12 three  times daily with meals 15 pen 3  . Insulin Degludec (TRESIBA FLEXTOUCH) 200 UNIT/ML SOPN Inject 20 Units into the skin daily. 3 pen 5  . Insulin Glargine (LANTUS SOLOSTAR) 100 UNIT/ML Solostar Pen Inject 45 Units into the skin daily. 5 pen PRN  . mupirocin ointment (BACTROBAN) 2 % Apply to rash twice daily. Dispense 30 gms. 22 g 3   No current facility-administered medications on file prior to visit.    No Known Allergies Family History  Problem Relation  Age of Onset  . Diabetes Mother        T2DM  . Hypertension Mother   . Diabetes Maternal Grandmother        T2DM  . Hypertension Maternal Grandmother   . Heart disease Maternal Grandmother   . Heart disease Maternal Grandfather   . Cancer Paternal Grandmother   . Diabetes Other        Granduncle has T1 DM.  Marland Kitchen Thyroid disease Neg Hx   . Obesity Neg Hx   . Kidney disease Neg Hx   . Anemia Neg Hx     PE: BP 128/72   Pulse 89   Ht 5' 9.5" (1.765 m)   Wt 134 lb 6.4 oz (61 kg)   SpO2 99%   BMI 19.56 kg/m  Wt Readings from Last 3 Encounters:  08/21/17 134 lb 6.4 oz (61 kg)  06/18/17 125 lb (56.7 kg)  07/03/16 134 lb (60.8 kg) (16 %, Z= -0.99)*   * Growth percentiles are based on CDC (Boys, 2-20 Years) data.   Constitutional:  normal weight , in NAD Eyes: PERRLA, EOMI, no exophthalmos ENT: moist mucous membranes, no thyromegaly, no cervical lymphadenopathy Cardiovascular: RRR, No MRG Respiratory: CTA B Gastrointestinal: abdomen soft, NT, ND, BS+ Musculoskeletal: no deformities, strength intact in all 4 Skin: moist, warm, no rashes Neurological: no tremor with outstretched hands, DTR normal in all 4  ASSESSMENT: 1. DM1, uncontrolled, without long term complications, but with hyperglycemia  2. HTG  PLAN:  1. Patient with long-standing, uncontrolled, type 1 diabetes, previously on insulin pump therapy, now on basal-bolus insulin regimen, but wanting to restart the pump.  He has a Medtronic 670 G insulin pump  at home,  received before last visit, however, he could not use the pump in the past because of not having enough fat on his body.  We decided to continue on insulin pens then and hopefully build some fat deposits before retrying the pump.  Since last visit, he gained 9 pounds.  Would like to gain another approximately 10 pounds before starting the pump.  I advised him to let me know when he is ready for this, as I will need to refer him to nutrition for carb pressure first. - At last visit, as his sugars were high throughout the day in the 200-300 range, I advised him to change to  [place] U200 and also lowered his target CBGs from 250-130. - At this visit, he is still on Lantus, he is just finishing his pens.  He already has [place] at home and will switched to this in the next few days - He also has a lot of NovoLog at home and we discussed about potentially using FiAsp -he will let me know when he is close to running out of his NovoLog so I can call it into his pharmacy - His sugars at home are still high, but better than before.  He feels that the higher sugars now are caused by him missing NovoLog doses or taking it too late after the meal.  We discussed about the importance of taking he should work on this first for adjusting the doses further.  The Lantus dose seems adequate. - Reviewed last HbA1c from 2 months ago: Undetectably high - I advised him to: Patient Instructions  Please continue: - Tresiba 20 units at bedtime - Novolog: ICR 1:15 ISF 1:50 Target 130  Please return in 2 months with your sugar log.   - Reviewed latest HbA1c which  was undetectably high -he is not due for another one yet. - continue checking sugars at different times of the day - check 4 at least x a day, rotating checks - advised for yearly eye exams >> he is UTD - Reviewed his labs from last visit in detail with patient and his stepmother - Return to clinic in 2 mo with sugar log   2. HTG -Patient's triglycerides are  very high at last visit, higher than 1400. -Discussed about the risk of having such high triglycerides, especially for pancreatitis -I advised him for dietary changes to reduce concentrated sweets and dietary fat -I also advised him to start fish oil/flaxseed oil 1000 mg twice a day -We will repeat his triglycerides at next visit (he refuses a lipid panel now) and may need to start a fibrate  Philemon Kingdom, MD PhD [facility]

## 2017-11-14 ENCOUNTER — Ambulatory Visit: Payer: BLUE CROSS/BLUE SHIELD | Admitting: Internal Medicine

## 2017-12-03 ENCOUNTER — Ambulatory Visit: Payer: BLUE CROSS/BLUE SHIELD | Admitting: Internal Medicine

## 2017-12-03 ENCOUNTER — Encounter: Payer: Self-pay | Admitting: Internal Medicine

## 2017-12-03 VITALS — BP 120/82 | HR 74 | Ht 69.5 in | Wt 128.4 lb

## 2017-12-03 DIAGNOSIS — E1042 Type 1 diabetes mellitus with diabetic polyneuropathy: Secondary | ICD-10-CM

## 2017-12-03 DIAGNOSIS — E049 Nontoxic goiter, unspecified: Secondary | ICD-10-CM | POA: Diagnosis not present

## 2017-12-03 DIAGNOSIS — M25511 Pain in right shoulder: Secondary | ICD-10-CM

## 2017-12-03 DIAGNOSIS — E781 Pure hyperglyceridemia: Secondary | ICD-10-CM

## 2017-12-03 DIAGNOSIS — IMO0002 Reserved for concepts with insufficient information to code with codable children: Secondary | ICD-10-CM

## 2017-12-03 DIAGNOSIS — E1065 Type 1 diabetes mellitus with hyperglycemia: Secondary | ICD-10-CM | POA: Diagnosis not present

## 2017-12-03 DIAGNOSIS — M25512 Pain in left shoulder: Secondary | ICD-10-CM

## 2017-12-03 LAB — POCT GLYCOSYLATED HEMOGLOBIN (HGB A1C): Hemoglobin A1C: 13 % — AB (ref 4.0–5.6)

## 2017-12-03 MED ORDER — INSULIN ASPART 100 UNIT/ML FLEXPEN: PEN_INJECTOR | SUBCUTANEOUS | 3 refills | Status: DC

## 2017-12-03 MED ORDER — INSULIN DEGLUDEC 200 UNIT/ML ~~LOC~~ SOPN: 24.0000 [IU] | PEN_INJECTOR | Freq: Every day | SUBCUTANEOUS | 5 refills | Status: DC

## 2017-12-03 NOTE — Progress Notes (Signed)
Patient ID: Phillip [name], male   DOB: [birth year], 22 y.o.   MRN: [id number]   HPI: Phillip [name] is a 22 y.o.-year-old male, initially referred by his PCP, Dr. Aurther Loft, for management of DM1, uncontrolled, with complications (PN).  Last visit 3.5 months ago.    He lost 6 lbs since last visit, as his b'fast restaurant closed down.  He is not trying to find another breakfast place  Patient has been diagnosed with diabetes  in 2013; he started insulin at diagnosis.  Reviewed pediatric endocrinology's note:  "The patient was admitted to the Pediatric Ward at [facility] on 06/05/11 for the chief complaint of new-onset type 1 diabetes mellitus, dehydration, and weight loss. His initial CBG was 389. Serum sodium was 132, CO2 26, and glucose 394. His initial hemoglobin A1c was 12.1%. His initial C-peptide was 0.31 (normal 0.89-3.80). TSH was 1.531. Free T4 was 1.07. Urine ketones were 40. Anti-islet cell antibody was elevated at 10 (normal < 5). Anti-insulin antibody and anti-GAD antibody were negative."  Last hemoglobin A1c was: Lab Results  Component Value Date   HGBA1C >15 06/18/2017   HGBA1C 13.0 07/03/2016   HGBA1C 13.7 04/09/2016   He is not on insulin pump now but was on the Medtronic 530 G pump from 2014-2017. He developed scar tissue at the infusion sites at that time and he had to stop the pump.  He would be interested to restart it.  He is on: - Lantus 45 >> 16 units at bedtime >> Tresiba 20 units at bedtime.  He likes [place]. - NovoLog: (7-12 units per meal) -approximately a month ago he left his entire NovoLog supply in the car >> he continues to use these, but sugars are higher in the last month. - ICR: 1:15 - ISF 1:50 - target 250 (!) >> 130  Meter: Bayer Contour  Pt checks his sugars 1-3X a day: - am: 280s >> 150-230 >> 336 - 2h after b'fast: n/c - before lunch: 220-240 >> 68-200, 250 >> 313, 333 - 2h after lunch: n/c - before dinner: n/c >> 292 - 2h  after dinner: 280-350 >> 200-300 >> 112, 248-458 - bedtime: n/c - nighttime: n/c >> 135 Lowest sugar was 34 (miscalculated insulin dose) - long time ago >> 68 >> 112; he has hypoglycemia awareness in the 70s.   No previous hypoglycemia admission.  He does have a non-expired glucagon kit at home Highest sugar was 480 >> 380 x1 (syrup) >> 458.  He had one DKA admission at diagnosis  + HL; total cholesterol and triglycerides high, HDL low: Lab Results  Component Value Date   CHOL 434 (H) 06/18/2017   HDL 31.50 (L) 06/18/2017   LDLCALC 73 12/22/2013   LDLDIRECT 105.0 06/18/2017   TRIG (H) 06/18/2017    1469.0 Triglyceride is over 400; calculations on Lipids are invalid.   CHOLHDL 14 06/18/2017  We started flax oil 1000 mg twice a day at last visit. - last eye exam was in 2018: No DR - Denies numbness and tingling in his feet.  Last TSH was normal: Lab Results  Component Value Date   TSH 0.96 06/18/2017   Pt has FH of DM in father's uncle - type 1, other members with DM2.   ROS: Constitutional: + weight loss, no fatigue, no subjective hyperthermia, no subjective hypothermia Eyes: no blurry vision, no xerophthalmia ENT: no sore throat, no nodules palpated in throat, no dysphagia, no odynophagia, no hoarseness Cardiovascular: no CP/no  SOB/no palpitations/no leg swelling Respiratory: no cough/no SOB/no wheezing Gastrointestinal: no N/no V/no D/no C/no acid reflux Musculoskeletal: no muscle aches/no joint aches Skin: no rashes, no hair loss Neurological: no tremors/no numbness/no tingling/no dizziness  I reviewed pt's medications, allergies, PMH, social hx, family hx, and changes were documented in the history of present illness. Otherwise, unchanged from my initial visit note.  Past Medical History:  Diagnosis Date  . ADHD (attention deficit hyperactivity disorder)   . Adjustment reaction   . Dehydration   . Diabetes mellitus type I ([place])   . Goiter   . Hypoglycemia    Past  Surgical History:  Procedure Laterality Date  . TYMPANOSTOMY TUBE PLACEMENT    . WRIST FRACTURE SURGERY     Social History   Socioeconomic History  . Marital status: Single    Spouse name: Not on file  . Number of children: Not on file  . Years of education: Not on file  . Highest education level: Not on file  Occupational History  . Not on file  Social Needs  . Financial resource strain: Not on file  . Food insecurity:    Worry: Not on file    Inability: Not on file  . Transportation needs:    Medical: Not on file    Non-medical: Not on file  Tobacco Use  . Smoking status: Former Smoker    Start date: 05/10/2014    Last attempt to quit: 10/09/2015    Years since quitting: 2.1  . Smokeless tobacco: Never Used  Substance and Sexual Activity  . Alcohol use: Yes    Alcohol/week: 1.8 oz    Types: 3 Cans of beer per week    Comment: social  . Drug use: Yes    Frequency: 1.0 times per week    Types: Marijuana    Comment: social  . Sexual activity: Never  Lifestyle  . Physical activity:    Days per week: Not on file    Minutes per session: Not on file  . Stress: Not on file  Relationships  . Social connections:    Talks on phone: Not on file    Gets together: Not on file    Attends religious service: Not on file    Active member of club or organization: Not on file    Attends meetings of clubs or organizations: Not on file    Relationship status: Not on file  . Intimate partner violence:    Fear of current or ex partner: Not on file    Emotionally abused: Not on file    Physically abused: Not on file    Forced sexual activity: Not on file  Other Topics Concern  . Not on file  Social History Narrative  . Not on file   Current Outpatient Medications on File Prior to Visit  Medication Sig Dispense Refill  . cetirizine (ZYRTEC) 10 MG tablet Take 10 mg by mouth daily. Reported on 07/27/2015    . FLUoxetine (PROZAC) 10 MG capsule Take 3 capsules (30 mg total) by mouth  daily. 90 capsule 1  . ibuprofen (ADVIL,MOTRIN) 200 MG tablet Take 200 mg by mouth every 6 (six) hours as needed for pain. Reported on 07/27/2015    . insulin aspart (NOVOLOG FLEXPEN) 100 UNIT/ML FlexPen Inject 8-12 three times daily with meals 15 pen 3  . Insulin Degludec (TRESIBA FLEXTOUCH) 200 UNIT/ML SOPN Inject 20 Units into the skin daily. 3 pen 5  . Insulin Glargine (LANTUS SOLOSTAR) 100 UNIT/ML  Solostar Pen Inject 45 Units into the skin daily. 5 pen PRN  . mupirocin ointment (BACTROBAN) 2 % Apply to rash twice daily. Dispense 30 gms. 22 g 3   No current facility-administered medications on file prior to visit.    No Known Allergies Family History  Problem Relation Age of Onset  . Diabetes Mother        T2DM  . Hypertension Mother   . Diabetes Maternal Grandmother        T2DM  . Hypertension Maternal Grandmother   . Heart disease Maternal Grandmother   . Heart disease Maternal Grandfather   . Cancer Paternal Grandmother   . Diabetes Other        Granduncle has T1 DM.  Marland Kitchen Thyroid disease Neg Hx   . Obesity Neg Hx   . Kidney disease Neg Hx   . Anemia Neg Hx     PE: BP 120/82   Pulse 74   Ht 5' 9.5" (1.765 m)   Wt 128 lb 6.4 oz (58.2 kg)   SpO2 98%   BMI 18.69 kg/m  Wt Readings from Last 3 Encounters:  12/03/17 128 lb 6.4 oz (58.2 kg)  08/21/17 134 lb 6.4 oz (61 kg)  06/18/17 125 lb (56.7 kg)   Constitutional: Normal weight, in NAD Eyes: PERRLA, EOMI, no exophthalmos ENT: moist mucous membranes, no thyromegaly, no cervical lymphadenopathy Cardiovascular: RRR, No MRG Respiratory: CTA B Gastrointestinal: abdomen soft, NT, ND, BS+ Musculoskeletal: no deformities, strength intact in all 4 Skin: moist, warm, no rashes Neurological: no tremor with outstretched hands, DTR normal in all 4  ASSESSMENT: 1. DM1, uncontrolled, without long term complications, but with hyperglycemia  2. HTG  3.  Joint pain  PLAN:  1. Patient with long-standing, uncontrolled, type 1  diabetes, previously on insulin pump, but now on basal-bolus insulin regimen after he developed scar tissue at the site of the infusion site.  He is interested to restart the pump, however, we need to make sure that he has enough fat on his body to accommodate this.  He already has a Medtronic 670 G insulin pump at home.  Before starting back on the pump, he will need a carb counting refresher. -When I first saw him, his sugars were high throughout the day, in the 200-300 range and we discussed about changing his basal insulin to [place] U200 and also lowering his target CBG from 250 to 130.  He did not start  [place] at last visit yet, as he was finishing his Lantus pens.  At this visit, he is on [place].  We also discussed about starting Fiasp but he had a lot of NovoLog at home at last visit and we will switch to this when he runs out. -At last visit, sugars were still high, but improved.  He was missing some NovoLog doses and this was responsible for the hyperglycemic spikes.  We did not change his regimen then. At this visit, sugars are higher, but mostly in the last month, after he left his NovoLog in the car.  I strongly advised him not to do so and always keep the supply of insulin with him.  He will need to get another NovoLog refill right away and start using it.  As his sugars are higher, we will also increase his [place] dose for now. - I advised him to: Patient Instructions  Please increase: - Tresiba to 24 units at bedtime  Please continue: - Novolog: ICR 1:15 ISF 1:50 Target: 130  Please change your Novolog supply.  Please stop at the lab.  If vitamin D is low, please start 2000 units vitamin D daily.  Please return in 3 months with your sugar log.   - today, HbA1c is 13% (lower, but still very high) - continue checking sugars at different times of the day - check 4x a day, rotating checks - advised for yearly eye exams >> he is UTD - Return to clinic in 3 mo with sugar  log    2. HTG -Patient with very high triglycerides, higher than 1400, after which we discussed about dietary changes to reduce concentrated sweets and dietary fat and we also started flax oil 1000 mg twice a day. -We will repeat a lipid panel now and start fenofibrate if needed  3.  Joint pain -He complains shoulder pain -We will check a vitamin D level  Philemon Kingdom, MD PhD [facility]

## 2017-12-03 NOTE — Patient Instructions (Addendum)
Please increase: - Tresiba to 24 units at bedtime  Please continue: - Novolog: ICR 1:15 ISF 1:50 Target: 130  Please change your Novolog supply.  Please stop at the lab.  If vitamin D is low, please start 2000 units vitamin D daily.  Please return in 3 months with your sugar log.

## 2017-12-03 NOTE — Addendum Note (Signed)
Addended by: Yolande JollyLAWSON, Prynce Jacober on: 12/03/2017 02:05 PM   Modules accepted: Orders

## 2017-12-04 ENCOUNTER — Other Ambulatory Visit: Payer: BLUE CROSS/BLUE SHIELD

## 2018-03-31 ENCOUNTER — Ambulatory Visit: Payer: BLUE CROSS/BLUE SHIELD | Admitting: Internal Medicine

## 2018-03-31 DIAGNOSIS — Z0289 Encounter for other administrative examinations: Secondary | ICD-10-CM

## 2018-03-31 NOTE — Progress Notes (Deleted)
Patient ID: Phillip [name], male   DOB: 09-25-1995, 22 y.o.   MRN: [id number]   HPI: Phillip [name] is a 22 y.o.-year-old male, initially referred by his PCP, Dr. Aurther Loft, for management of DM1, uncontrolled, with complications (PN).  Last visit 4 months ago.  He was diagnosed with diabetes in 2013; he started insulin at diagnosis.    Reviewed pediatric endocrinologist's note: "The patient was admitted to the Pediatric Ward at [facility] on 06/05/11 for the chief complaint of new-onset type 1 diabetes mellitus, dehydration, and weight loss. His initial CBG was 389. Serum sodium was 132, CO2 26, and glucose 394. His initial hemoglobin A1c was 12.1%. His initial C-peptide was 0.31 (normal 0.89-3.80). TSH was 1.531. Free T4 was 1.07. Urine ketones were 40. Anti-islet cell antibody was elevated at 10 (normal < 5). Anti-insulin antibody and anti-GAD antibody were negative."  Last hemoglobin A1c was: Lab Results  Component Value Date   HGBA1C 13.0 (A) 12/03/2017   HGBA1C >15 06/18/2017   HGBA1C 13.0 07/03/2016   He is not on insulin pump now but was on the Medtronic 630 G insulin pump from 2014 06/2015.  He developed scar tissue at the infusion sites at the time and he had to stop the pump.  He is interested to restart it.  He has a new Medtronic insulin pump at home.  He is on: - Lantus 45 >> 16 units at bedtime >> Tresiba 20 >> 24 units at bedtime - NovoLog: (7-12 units per meal) - ICR: 1:15 - ISF 1:50 - target 250 (!) >> 130  Meter: Bayer Contour  Pt checks his sugars 1-3x a day: - am: 280s >> 150-230 >> 336 - 2h after b'fast: n/c - before lunch: 68-200, 250 >> 313, 333 - 2h after lunch: n/c - before dinner: n/c >> 292 - 2h after dinner: 200-300 >> 112, 248-458 - bedtime: n/c - nighttime: n/c >> 135 Lowest sugar was 34 (miscalculated insulin dose) - long time ago >> 68 >> 112 >> ***; he has hypoglycemia awareness in the 70s.   No previous hypoglycemia admission.  He  has a non-expired glucagon kit at home. Highest sugar was 458 >> ***.  He had a DKA admission at diagnosis.  He has hyperlipidemia: Total cholesterol high, HDL low, triglycerides very high: Lab Results  Component Value Date   CHOL 434 (H) 06/18/2017   HDL 31.50 (L) 06/18/2017   LDLCALC 73 12/22/2013   LDLDIRECT 105.0 06/18/2017   TRIG (H) 06/18/2017    1469.0 Triglyceride is over 400; calculations on Lipids are invalid.   CHOLHDL 14 06/18/2017  Restarted flax oil 1000 mg 2x a day. - last eye exam was in 2018: No DR - no numbness and tingling in his feet.  Last TSH normal: Lab Results  Component Value Date   TSH 0.96 06/18/2017   Pt has FH of DM in father's uncle - type 1, other members with DM2.   ROS: Constitutional: no weight gain/no weight loss, no fatigue, no subjective hyperthermia, no subjective hypothermia Eyes: no blurry vision, no xerophthalmia ENT: no sore throat, no nodules palpated in neck, no dysphagia, no odynophagia, no hoarseness Cardiovascular: no CP/no SOB/no palpitations/no leg swelling Respiratory: no cough/no SOB/no wheezing Gastrointestinal: no N/no V/no D/no C/no acid reflux Musculoskeletal: no muscle aches/no joint aches Skin: no rashes, no hair loss Neurological: no tremors/no numbness/no tingling/no dizziness  I reviewed pt's medications, allergies, PMH, social hx, family hx, and changes were documented in  the history of present illness. Otherwise, unchanged from my initial visit note.  Past Medical History:  Diagnosis Date  . ADHD (attention deficit hyperactivity disorder)   . Adjustment reaction   . Dehydration   . Diabetes mellitus type I ([place])   . Goiter   . Hypoglycemia    Past Surgical History:  Procedure Laterality Date  . TYMPANOSTOMY TUBE PLACEMENT    . WRIST FRACTURE SURGERY     Social History   Socioeconomic History  . Marital status: Single    Spouse name: Not on file  . Number of children: Not on file  . Years of  education: Not on file  . Highest education level: Not on file  Occupational History  . Not on file  Social Needs  . Financial resource strain: Not on file  . Food insecurity:    Worry: Not on file    Inability: Not on file  . Transportation needs:    Medical: Not on file    Non-medical: Not on file  Tobacco Use  . Smoking status: Former Smoker    Start date: 05/10/2014    Last attempt to quit: 10/09/2015    Years since quitting: 2.4  . Smokeless tobacco: Never Used  Substance and Sexual Activity  . Alcohol use: Yes    Alcohol/week: 3.0 standard drinks    Types: 3 Cans of beer per week    Comment: social  . Drug use: Yes    Frequency: 1.0 times per week    Types: Marijuana    Comment: social  . Sexual activity: Never  Lifestyle  . Physical activity:    Days per week: Not on file    Minutes per session: Not on file  . Stress: Not on file  Relationships  . Social connections:    Talks on phone: Not on file    Gets together: Not on file    Attends religious service: Not on file    Active member of club or organization: Not on file    Attends meetings of clubs or organizations: Not on file    Relationship status: Not on file  . Intimate partner violence:    Fear of current or ex partner: Not on file    Emotionally abused: Not on file    Physically abused: Not on file    Forced sexual activity: Not on file  Other Topics Concern  . Not on file  Social History Narrative  . Not on file   Current Outpatient Medications on File Prior to Visit  Medication Sig Dispense Refill  . cetirizine (ZYRTEC) 10 MG tablet Take 10 mg by mouth daily. Reported on 07/27/2015    . FLUoxetine (PROZAC) 10 MG capsule Take 3 capsules (30 mg total) by mouth daily. 90 capsule 1  . ibuprofen (ADVIL,MOTRIN) 200 MG tablet Take 200 mg by mouth every 6 (six) hours as needed for pain. Reported on 07/27/2015    . insulin aspart (NOVOLOG FLEXPEN) 100 UNIT/ML FlexPen Inject 8-12 three times daily with meals  15 pen 3  . Insulin Degludec (TRESIBA FLEXTOUCH) 200 UNIT/ML SOPN Inject 24 Units into the skin daily. 3 pen 5  . mupirocin ointment (BACTROBAN) 2 % Apply to rash twice daily. Dispense 30 gms. 22 g 3   No current facility-administered medications on file prior to visit.    No Known Allergies Family History  Problem Relation Age of Onset  . Diabetes Mother        T2DM  . Hypertension  Mother   . Diabetes Maternal Grandmother        T2DM  . Hypertension Maternal Grandmother   . Heart disease Maternal Grandmother   . Heart disease Maternal Grandfather   . Cancer Paternal Grandmother   . Diabetes Other        Granduncle has T1 DM.  Marland Kitchen Thyroid disease Neg Hx   . Obesity Neg Hx   . Kidney disease Neg Hx   . Anemia Neg Hx     PE: There were no vitals taken for this visit. Wt Readings from Last 3 Encounters:  12/03/17 128 lb 6.4 oz (58.2 kg)  08/21/17 134 lb 6.4 oz (61 kg)  06/18/17 125 lb (56.7 kg)   Constitutional: Normal weight, in NAD Eyes: PERRLA, EOMI, no exophthalmos ENT: moist mucous membranes, no thyromegaly, no cervical lymphadenopathy Cardiovascular: RRR, No MRG Respiratory: CTA B Gastrointestinal: abdomen soft, NT, ND, BS+ Musculoskeletal: no deformities, strength intact in all 4 Skin: moist, warm, no rashes Neurological: no tremor with outstretched hands, DTR normal in all 4  ASSESSMENT: 1. DM1, uncontrolled, without long term complications, but with hyperglycemia  2. HTG  3. PN  4. Goiter  PLAN:  1. Patient with long-standing, uncontrolled, type 1 diabetes, previously on insulin pump, but not on a basal-bolus insulin regimen after he developed scar tissue at the infusion sites.  He is interested in restarting the pump but we need to make sure he has enough fat on his body to accommodate this.  He has a Medtronic 670 G insulin pump at home.  Before starting back on the pump, he will need a carb counting refresher. -At last visit, we increased his Tresiba dose  as his sugars were high in the 200-3 100s range throughout the day.  At that time, however, he forgot his NovoLog supply in the car and I suspect that the insulin became inactivated.  I advised him to change his NovoLog supply and did not change the bolus parameters then.  We did discuss at that time about switching from NovoLog to [place].  We can do this in the near future. -He is doing better with remembering to take his insulin but still has some missed doses.   - I advised him to: Patient Instructions  Please continue: - Tresiba 24 units daily - Novolog: ICR 1:15 ISF 1:50 Target: 130  Please stop at the lab.  Please return in 3 months with your sugar log.   - today, HbA1c is 7%  - continue checking sugars at different times of the day - check 3x a day, rotating checks - advised for yearly eye exams >> he is UTD - Return to clinic in 3 mo with sugar log     2. HL - Reviewed latest lipid panel; triglycerides were extremely high at last check in 06/2017: Lab Results  Component Value Date   CHOL 434 (H) 06/18/2017   HDL 31.50 (L) 06/18/2017   LDLCALC 73 12/22/2013   LDLDIRECT 105.0 06/18/2017   TRIG (H) 06/18/2017    1469.0 Triglyceride is over 400; calculations on Lipids are invalid.   CHOLHDL 14 06/18/2017  -At that time, we discussed about dietary changes to reduce concentrated sweets and fatty foods and we also started flax oil 1000 mg twice a day. -We will check a lipid panel now and start fenofibrate if needed.  3.  Peripheral neuropathy - stable  4. Goiter -No neck compression symptoms -latest TSH was reviewed and this was normal: Lab Results  Component Value Date   TSH 0.96 06/18/2017    Philemon Kingdom, MD PhD [facility] Endocrinology

## 2018-10-31 ENCOUNTER — Other Ambulatory Visit: Payer: Self-pay | Admitting: Internal Medicine

## 2019-01-01 ENCOUNTER — Other Ambulatory Visit: Payer: Self-pay

## 2019-01-02 ENCOUNTER — Ambulatory Visit (INDEPENDENT_AMBULATORY_CARE_PROVIDER_SITE_OTHER): Payer: Self-pay | Admitting: Internal Medicine

## 2019-01-02 ENCOUNTER — Encounter: Payer: Self-pay | Admitting: Internal Medicine

## 2019-01-02 VITALS — BP 112/60 | HR 73 | Ht 69.5 in | Wt 129.2 lb

## 2019-01-02 DIAGNOSIS — IMO0002 Reserved for concepts with insufficient information to code with codable children: Secondary | ICD-10-CM

## 2019-01-02 DIAGNOSIS — E781 Pure hyperglyceridemia: Secondary | ICD-10-CM

## 2019-01-02 DIAGNOSIS — E1065 Type 1 diabetes mellitus with hyperglycemia: Secondary | ICD-10-CM

## 2019-01-02 DIAGNOSIS — E1042 Type 1 diabetes mellitus with diabetic polyneuropathy: Secondary | ICD-10-CM

## 2019-01-02 LAB — POCT GLYCOSYLATED HEMOGLOBIN (HGB A1C): Hemoglobin A1C: 13.2 % — AB (ref 4.0–5.6)

## 2019-01-02 MED ORDER — TRESIBA FLEXTOUCH 200 UNIT/ML ~~LOC~~ SOPN: 24.0000 [IU] | PEN_INJECTOR | Freq: Every day | SUBCUTANEOUS | 3 refills | Status: DC

## 2019-01-02 MED ORDER — GLUCAGON EMERGENCY 1 MG IJ KIT: 1.0000 mg | PACK | Freq: Once | INTRAMUSCULAR | 12 refills | Status: DC | PRN

## 2019-01-02 MED ORDER — INSULIN ASPART FLEXPEN 100 UNIT/ML ~~LOC~~ SOPN: 8.0000 [IU] | PEN_INJECTOR | Freq: Three times a day (TID) | SUBCUTANEOUS | 3 refills | Status: DC

## 2019-01-02 MED ORDER — CONTOUR NEXT TEST VI STRP: ORAL_STRIP | 3 refills | Status: DC

## 2019-01-02 NOTE — Patient Instructions (Addendum)
Please continue: - Tresiba 24 units but take it EVERY DAY, in am - Novolog - move this 15 min before a meal: ICR 1:15 ISF 1:50 Target: 250 >> 150  Please stop at the lab.  Please return in 3 months with your sugar log.

## 2019-01-02 NOTE — Progress Notes (Signed)
Patient ID: Phillip [name], male   DOB: Jun 14, 1995, 23 y.o.   MRN: [id number]   HPI: Phillip [name] is a 23 y.o.-year-old male, initially referred by his PCP, Dr. Aurther Loft, returning for follow-up for DM1, uncontrolled, with complications (PN).  Last visit 1 year and 1 month ago!  At this visit, he tells me that: - He misses [place] doses: takes it 1-2x a week!  - His tests strips are expired since 2017!  - He also takes the Novolog after the meals!    Patient has been diagnosed with diabetes  in 2013; he started insulin at diagnosis.  Reviewed pediatric endocrinology's note:  "The patient was admitted to the Pediatric Ward at [facility] on 06/05/11 for the chief complaint of new-onset type 1 diabetes mellitus, dehydration, and weight loss. His initial CBG was 389. Serum sodium was 132, CO2 26, and glucose 394. His initial hemoglobin A1c was 12.1%. His initial C-peptide was 0.31 (normal 0.89-3.80). TSH was 1.531. Free T4 was 1.07. Urine ketones were 40. Anti-islet cell antibody was elevated at 10 (normal < 5). Anti-insulin antibody and anti-GAD antibody were negative."  Last hemoglobin A1c was: Lab Results  Component Value Date   HGBA1C 13.0 (A) 12/03/2017   HGBA1C >15 06/18/2017   HGBA1C 13.0 07/03/2016   He is not on insulin pump now but was on the Medtronic 530 G pump from 2014-2017. He developed scar tissue at the infusion site at that time and had to stop the pump, he would be interested to restart it.  He is on: - Lantus 45 >> 16 units at bedtime >> Tresiba 20 >> 24 units at bedtime.  He feels better on Tresiba. - NovoLog: (7-12 units per meal) ICR 1:15 ISF 1:50 Target: 250 (not using the 130)  Meter: Bayer Contour  Pt checks his sugars 1-3 times a day >> 250-500: - am: 280s >> 150-230 >> 336 >> 60, 80s, 120, 150-350 - 2h after b'fast: n/c - before lunch: 220-240 >> 68-200, 250 >> 313, 333 >> n/c (snack, banana, chips) - 2h after lunch: n/c - before dinner:  n/c >> 292 - 2h after dinner: 280-350 >> 200-300 >> 112, 248-458 >> 60, up to 500 - bedtime: n/c - nighttime: n/c >> 135 >> see above Lowest sugar was 34 (miscalculated insulin dose) - long time ago >> 68 >> 112 >> 58, 64; he has hypoglycemia awareness in the 70s.   No previous hypoglycemia admission.  He has a  expired glucagon kit at home. Highest sugar was 480 >> 380 x1 (syrup) >> 458 >> 500.  He had one DKA admission at diagnosis.  + Hypertriglyceridemia: Lab Results  Component Value Date   CHOL 434 (H) 06/18/2017   HDL 31.50 (L) 06/18/2017   LDLCALC 73 12/22/2013   LDLDIRECT 105.0 06/18/2017   TRIG (H) 06/18/2017    1469.0 Triglyceride is over 400; calculations on Lipids are invalid.   CHOLHDL 14 06/18/2017  He is on flax oil 1000 mg twice a day >> now takes this rarely. - last eye exam was in 2018: No DR -No numbness and tingling in his feet.  Latest TSH was normal: Lab Results  Component Value Date   TSH 0.96 06/18/2017   Pt has FH of DM in father's uncle - type 1, other members with DM2.    ROS: Constitutional: no weight gain/no weight loss, no fatigue, no subjective hyperthermia, no subjective hypothermia Eyes: no blurry vision, no xerophthalmia ENT: no sore  throat, no nodules palpated in neck, no dysphagia, no odynophagia, no hoarseness Cardiovascular: no CP/no SOB/no palpitations/no leg swelling Respiratory: no cough/no SOB/no wheezing Gastrointestinal: no N/no V/no D/no C/no acid reflux Musculoskeletal: no muscle aches/no joint aches Skin: no rashes, no hair loss Neurological: no tremors/no numbness/no tingling/no dizziness  I reviewed pt's medications, allergies, PMH, social hx, family hx, and changes were documented in the history of present illness. Otherwise, unchanged from my initial visit note.  Past Medical History:  Diagnosis Date  . ADHD (attention deficit hyperactivity disorder)   . Adjustment reaction   . Dehydration   . Diabetes mellitus type I  ([place])   . Goiter   . Hypoglycemia    Past Surgical History:  Procedure Laterality Date  . TYMPANOSTOMY TUBE PLACEMENT    . WRIST FRACTURE SURGERY     Social History   Socioeconomic History  . Marital status: Single    Spouse name: Not on file  . Number of children: Not on file  . Years of education: Not on file  . Highest education level: Not on file  Occupational History  . Not on file  Social Needs  . Financial resource strain: Not on file  . Food insecurity    Worry: Not on file    Inability: Not on file  . Transportation needs    Medical: Not on file    Non-medical: Not on file  Tobacco Use  . Smoking status: Former Smoker    Start date: 05/10/2014    Quit date: 10/09/2015    Years since quitting: 3.2  . Smokeless tobacco: Never Used  Substance and Sexual Activity  . Alcohol use: Yes    Alcohol/week: 3.0 standard drinks    Types: 3 Cans of beer per week    Comment: social  . Drug use: Yes    Frequency: 1.0 times per week    Types: Marijuana    Comment: social  . Sexual activity: Never  Lifestyle  . Physical activity    Days per week: Not on file    Minutes per session: Not on file  . Stress: Not on file  Relationships  . Social Herbalist on phone: Not on file    Gets together: Not on file    Attends religious service: Not on file    Active member of club or organization: Not on file    Attends meetings of clubs or organizations: Not on file    Relationship status: Not on file  . Intimate partner violence    Fear of current or ex partner: Not on file    Emotionally abused: Not on file    Physically abused: Not on file    Forced sexual activity: Not on file  Other Topics Concern  . Not on file  Social History Narrative  . Not on file   Current Outpatient Medications on File Prior to Visit  Medication Sig Dispense Refill  . cetirizine (ZYRTEC) 10 MG tablet Take 10 mg by mouth daily. Reported on 07/27/2015    . ibuprofen (ADVIL,MOTRIN) 200 MG  tablet Take 200 mg by mouth every 6 (six) hours as needed for pain. Reported on 07/27/2015    . Insulin Aspart FlexPen 100 UNIT/ML SOPN Inject 8-12 Units into the skin 3 (three) times daily with meals. MUST HAVE APPOINTMENT FOR REFILLS 15 mL 0  . Insulin Degludec (TRESIBA FLEXTOUCH) 200 UNIT/ML SOPN Inject 24 Units into the skin daily. 3 pen 5  . mupirocin ointment (  BACTROBAN) 2 % Apply to rash twice daily. Dispense 30 gms. 22 g 3  . FLUoxetine (PROZAC) 10 MG capsule Take 3 capsules (30 mg total) by mouth daily. 90 capsule 1   No current facility-administered medications on file prior to visit.    No Known Allergies Family History  Problem Relation Age of Onset  . Diabetes Mother        T2DM  . Hypertension Mother   . Diabetes Maternal Grandmother        T2DM  . Hypertension Maternal Grandmother   . Heart disease Maternal Grandmother   . Heart disease Maternal Grandfather   . Cancer Paternal Grandmother   . Diabetes Other        Granduncle has T1 DM.  Marland Kitchen Thyroid disease Neg Hx   . Obesity Neg Hx   . Kidney disease Neg Hx   . Anemia Neg Hx     PE: BP 112/60 (BP Location: Right Arm, Patient Position: Sitting, Cuff Size: Normal)   Pulse 73   Ht 5' 9.5" (1.765 m)   Wt 129 lb 3.2 oz (58.6 kg)   SpO2 97%   BMI 18.81 kg/m  Wt Readings from Last 3 Encounters:  01/02/19 129 lb 3.2 oz (58.6 kg)  12/03/17 128 lb 6.4 oz (58.2 kg)  08/21/17 134 lb 6.4 oz (61 kg)   Constitutional: normal weight, in NAD Eyes: PERRLA, EOMI, no exophthalmos ENT: moist mucous membranes, no thyromegaly, no cervical lymphadenopathy Cardiovascular: RRR, No MRG Respiratory: CTA B Gastrointestinal: abdomen soft, NT, ND, BS+ Musculoskeletal: no deformities, strength intact in all 4 Skin: moist, warm, no rashes Neurological: no tremor with outstretched hands, DTR normal in all 4  ASSESSMENT: 1. DM1, uncontrolled, without long term complications, but with hyperglycemia  2. HTG  3.  Joint pain  PLAN:   1. Patient with longstanding, uncontrolled, type 1 diabetes, previously on insulin pump, but now on basal-bolus insulin regimen after he developed scar tissue at the site of the infusion.  He was interested to restart a pump in the past but he does not have enough fat on his abdomen to accommodate this.  Also, he was noncompliant with appointments.  He does have a Medtronic 670 G insulin pump at home. -At last visit, his sugars were higher after NovoLog was left in the car and I advised him to change this.  We also increased his Tresiba dose. -At this visit, he tells me that he is not taking [place] more than 1-2 times a week.  I strongly advised him to take it every day, as, if not, he is at high risk for DKA and also we will be able to control his diabetes.  He agrees to do so.  States he may forget to take it at night, I advised him to move it to the morning.  We will not change the dose today. -Also, he tells me that he is that strips are expired since 2017.  I sent a new prescription to his pharmacy- -he tells me that he is taking NovoLog after the meals, despite repeated advice to take it before the meals.  We discussed about to decide about the insulin dose if he takes the insulin before the meals.  He agrees to do this. -He did not change to a target from 250 to 130 as advised at last 2 visits.  I again advised him to decrease it but only to 150 to be easier to calculate the correction dose. -Refilled his prescriptions  including glucagon - I advised him to: Patient Instructions  Please continue: - Tresiba 24 units but take it EVERY DAY, in am - Novolog - move this 15 min before a meal: ICR 1:15 ISF 1:50 Target: 250 >> 150  Please stop at the lab.  Please return in 3 months with your sugar log.   - we checked his HbA1c: 13.2% (higher) - advised to check sugars at different times of the day - 4x a day, rotating check times - advised to schedule a yearly eye exam as he is not UTD  -  return to clinic in 3 months    2. HTG -He has very high triglycerides, >1400 >> we discussed about dietary changes: reduce concentrated sweets, and dietary fats, and also started flax oil 1000 mg twice a day.  He tells me that he has not really taken this. -Reviewed his latest lipid panel: Lab Results  Component Value Date   CHOL 434 (H) 06/18/2017   HDL 31.50 (L) 06/18/2017   LDLCALC 73 12/22/2013   LDLDIRECT 105.0 06/18/2017   TRIG (H) 06/18/2017    1469.0 Triglyceride is over 400; calculations on Lipids are invalid.   CHOLHDL 14 06/18/2017  -We will recheck it today  3.  Joint pain -At last visit he complained of left shoulder pain and other joint pains -These are now resolved   Philemon Kingdom, MD PhD [facility] Endocrinology

## 2019-03-27 ENCOUNTER — Ambulatory Visit: Payer: BLUE CROSS/BLUE SHIELD | Admitting: Internal Medicine

## 2019-11-18 LAB — HM DIABETES EYE EXAM

## 2019-11-20 ENCOUNTER — Other Ambulatory Visit: Payer: Self-pay | Admitting: Internal Medicine

## 2020-03-04 ENCOUNTER — Other Ambulatory Visit: Payer: Self-pay | Admitting: Internal Medicine

## 2020-03-04 DIAGNOSIS — E1065 Type 1 diabetes mellitus with hyperglycemia: Secondary | ICD-10-CM

## 2020-06-09 ENCOUNTER — Other Ambulatory Visit: Payer: Self-pay | Admitting: Internal Medicine

## 2020-06-27 ENCOUNTER — Other Ambulatory Visit: Payer: Self-pay | Admitting: Internal Medicine

## 2020-07-04 ENCOUNTER — Other Ambulatory Visit: Payer: Self-pay

## 2020-07-04 ENCOUNTER — Ambulatory Visit: Payer: Self-pay | Admitting: Internal Medicine

## 2020-07-04 ENCOUNTER — Encounter: Payer: Self-pay | Admitting: Internal Medicine

## 2020-07-04 VITALS — BP 128/70 | HR 101 | Ht 69.5 in | Wt 141.6 lb

## 2020-07-04 DIAGNOSIS — E1065 Type 1 diabetes mellitus with hyperglycemia: Secondary | ICD-10-CM

## 2020-07-04 DIAGNOSIS — IMO0002 Reserved for concepts with insufficient information to code with codable children: Secondary | ICD-10-CM

## 2020-07-04 DIAGNOSIS — E781 Pure hyperglyceridemia: Secondary | ICD-10-CM

## 2020-07-04 DIAGNOSIS — E1042 Type 1 diabetes mellitus with diabetic polyneuropathy: Secondary | ICD-10-CM

## 2020-07-04 LAB — POCT GLYCOSYLATED HEMOGLOBIN (HGB A1C): Hemoglobin A1C: 10.4 % — AB (ref 4.0–5.6)

## 2020-07-04 MED ORDER — TRESIBA FLEXTOUCH 200 UNIT/ML ~~LOC~~ SOPN: PEN_INJECTOR | SUBCUTANEOUS | 1 refills | Status: DC

## 2020-07-04 MED ORDER — CONTOUR NEXT TEST VI STRP: ORAL_STRIP | 3 refills | Status: DC

## 2020-07-04 MED ORDER — INSULIN PEN NEEDLE 32G X 4 MM MISC
3 refills | Status: DC
Start: 2020-07-04 — End: 2022-01-16

## 2020-07-04 MED ORDER — GLUCAGON 3 MG/DOSE NA POWD: 3.0000 mg | Freq: Once | NASAL | 11 refills | Status: DC | PRN

## 2020-07-04 MED ORDER — NOVOLOG FLEXPEN 100 UNIT/ML ~~LOC~~ SOPN: PEN_INJECTOR | SUBCUTANEOUS | 1 refills | Status: DC

## 2020-07-04 NOTE — Patient Instructions (Signed)
Please stop ALL sweet drinks.  Please continue: - Tresiba 24 units daily  Change: ICR 1:15 (try 1:12) - inject 15 min before meals  ISF 1:50 Target: 250 >> 150

## 2020-07-04 NOTE — Progress Notes (Signed)
Patient ID: Phillip [name], male   DOB: Apr 26, 1996, 25 y.o.   MRN: [id number]   This visit occurred during the SARS-CoV-2 public health emergency.  Safety protocols were in place, including screening questions prior to the visit, additional usage of staff PPE, and extensive cleaning of exam room while observing appropriate contact time as indicated for disinfecting solutions.   HPI: Phillip [name] is a 25 y.o.-year-old male, initially referred by his PCP, Dr. Aurther [name], returning for follow-up for DM1, uncontrolled, with complications (PN).  Last visit 1.5 years ago.  At last visit, he was very noncompliant with recommended regimen, - He misses Tresiba doses: takes it 1-2x a week!  - His tests strips are expired since 2017!  - He also takes the Novolog after the meals!    At this visit:  - he returns after 1 year and 6 months (previous visit 1 year and 1 months prior) - Continues to take NovoLog after the meals  Patient has been diagnosed with diabetes  in 2013; he started insulin at diagnosis.  Reviewed his pediatric endocrinologist's note:  "The patient was admitted to the Pediatric Ward at [facility] on 06/05/11 for the chief complaint of new-onset type 1 diabetes mellitus, dehydration, and weight loss. His initial CBG was 389. Serum sodium was 132, CO2 26, and glucose 394. His initial hemoglobin A1c was 12.1%. His initial C-peptide was 0.31 (normal 0.89-3.80). TSH was 1.531. Free T4 was 1.07. Urine ketones were 40. Anti-islet cell antibody was elevated at 10 (normal < 5). Anti-insulin antibody and anti-GAD antibody were negative."  Reviewed HbA1c levels: Lab Results  Component Value Date   HGBA1C 13.2 (A) 01/02/2019   HGBA1C 13.0 (A) 12/03/2017   HGBA1C >15 06/18/2017   He is not on insulin pump now but was on the Medtronic 530 G pump from 2014-2017. She developed scar tissue at the infusion site at that time and had to stop the pump, but he is interested to restart  it.  He is on: - Lantus 45 >> 16 units at bedtime >> Tresiba 20 >> 24 units at bedtime.  He feels better on Tresiba. - NovoLog: (7-12 units per meal) >> 12-16 units AFTER meals ICR 1:15 ISF 1:50 Target: 250 (not using 150, as suggested at last visit)  Meter: Contour  Pt checks his sugars 1-3 times a day: - am: 150-230 >> 336 >> 60, 80s, 120, 150-350 >> 140, 150-230, 310 - 2h after b'fast: n/c - before lunch: 68-200, 250 >> 313, 333 >> n/c >> 90-220 - 2h after lunch: n/c - before dinner: n/c >> 292 >> 160-250 (snack) - 2h after dinner: 112, 248-458 >> 60, up to 500 >> 80-200 - bedtime: n/c - nighttime: n/c >> 135 >> see above Lowest sugar was 34 (miscalculated insulin dose)...>> 58, 64 >> 52; he has hypoglycemia awareness in the 70s.   No previous hypoglycemia admission.  At last visit, he had unexpired glucagon kit at home. Highest sugar was 458 >> 500 >> 360.  He had one DKA admission at diagnosis.  + Hypertriglyceridemia: Lab Results  Component Value Date   CHOL 434 (H) 06/18/2017   HDL 31.50 (L) 06/18/2017   LDLCALC 73 12/22/2013   LDLDIRECT 105.0 06/18/2017   TRIG (H) 06/18/2017    1469.0 Triglyceride is over 400; calculations on Lipids are invalid.   CHOLHDL 14 06/18/2017  He is supposed to be on flax oil 1000 mg twice a day >> stopped approximately 6 months ago,  previously inconsistently. - last eye exam was in 10/2019: No DR reportedly - no numbness and tingling in his feet, but occasional pain  Review latest TSH: Lab Results  Component Value Date   TSH 0.96 06/18/2017   Pt has FH of DM in father's uncle - type 1, other members with DM2.   ROS: Constitutional: no weight gain/no weight loss, no fatigue, no subjective hyperthermia, no subjective hypothermia Eyes: no blurry vision, no xerophthalmia ENT: no sore throat, no nodules palpated in neck, no dysphagia, no odynophagia, no hoarseness Cardiovascular: no CP/no SOB/no palpitations/no leg swelling Respiratory:  no cough/no SOB/no wheezing Gastrointestinal: no N/no V/no D/no C/no acid reflux Musculoskeletal: no muscle aches/no joint aches Skin: no rashes, no hair loss Neurological: no tremors/no numbness/no tingling/no dizziness  I reviewed pt's medications, allergies, PMH, social hx, family hx, and changes were documented in the history of present illness. Otherwise, unchanged from my initial visit note.  Past Medical History:  Diagnosis Date  . ADHD (attention deficit hyperactivity disorder)   . Adjustment reaction   . Dehydration   . Diabetes mellitus type I ([place])   . Goiter   . Hypoglycemia    Past Surgical History:  Procedure Laterality Date  . TYMPANOSTOMY TUBE PLACEMENT    . WRIST FRACTURE SURGERY     Social History   Socioeconomic History  . Marital status: Single    Spouse name: Not on file  . Number of children: Not on file  . Years of education: Not on file  . Highest education level: Not on file  Occupational History  . Not on file  Tobacco Use  . Smoking status: Former Smoker    Start date: 05/10/2014    Quit date: 10/09/2015    Years since quitting: 4.7  . Smokeless tobacco: Never Used  Substance and Sexual Activity  . Alcohol use: Yes    Alcohol/week: 3.0 standard drinks    Types: 3 Cans of beer per week    Comment: social  . Drug use: Yes    Frequency: 1.0 times per week    Types: Marijuana    Comment: social  . Sexual activity: Never  Other Topics Concern  . Not on file  Social History Narrative  . Not on file   Social Determinants of Health   Financial Resource Strain: Not on file  Food Insecurity: Not on file  Transportation Needs: Not on file  Physical Activity: Not on file  Stress: Not on file  Social Connections: Not on file  Intimate Partner Violence: Not on file   Current Outpatient Medications on File Prior to Visit  Medication Sig Dispense Refill  . glucagon (GLUCAGON EMERGENCY) 1 MG injection Inject 1 mg into the muscle once as needed  for up to 1 dose. 1 each 12  . glucose blood (CONTOUR NEXT TEST) test strip Use as instructed 4x 300 each 3  . NOVOLOG FLEXPEN 100 UNIT/ML FlexPen ADMINISTER 8 TO 12 UNITS UNDER THE SKIN THREE TIMES DAILY WITH MEALS 15 mL 3  . TRESIBA FLEXTOUCH 200 UNIT/ML FlexTouch Pen INJECT 24 UNITS UNDER THE SKIN DAILY 27 mL 0   No current facility-administered medications on file prior to visit.   No Known Allergies Family History  Problem Relation Age of Onset  . Diabetes Mother        T2DM  . Hypertension Mother   . Diabetes Maternal Grandmother        T2DM  . Hypertension Maternal Grandmother   . Heart disease  Maternal Grandmother   . Heart disease Maternal Grandfather   . Cancer Paternal Grandmother   . Diabetes Other        Granduncle has T1 DM.  Marland Kitchen Thyroid disease Neg Hx   . Obesity Neg Hx   . Kidney disease Neg Hx   . Anemia Neg Hx     PE: BP 128/70   Pulse (!) 101   Ht 5' 9.5" (1.765 m)   Wt 141 lb 9.6 oz (64.2 kg)   SpO2 99%   BMI 20.61 kg/m  Wt Readings from Last 3 Encounters:  07/04/20 141 lb 9.6 oz (64.2 kg)  01/02/19 129 lb 3.2 oz (58.6 kg)  12/03/17 128 lb 6.4 oz (58.2 kg)   Constitutional: normal weight, in NAD Eyes: PERRLA, EOMI, no exophthalmos ENT: moist mucous membranes, no thyromegaly, no cervical lymphadenopathy Cardiovascular: tachycardia, RR, No MRG Respiratory: CTA B Gastrointestinal: abdomen soft, NT, ND, BS+ Musculoskeletal: no deformities, strength intact in all 4 Skin: moist, warm, no rashes Neurological: no tremor with outstretched hands, DTR normal in all 4  ASSESSMENT: 1. DM1, uncontrolled, without long term complications, but with hyperglycemia  2. HTG  PLAN:  1. Patient with longstanding, uncontrolled, type 1 diabetes, previously on slim pump, but now on a basal-bolus insulin regimen after he developed scar tissue at the site of the infusion.  She was interested in restarting the pump in the past but he did not have enough data on his  abdomen to accommodate this.  Also, he was noncompliant with appointments.  At last visit he was felt to me that he had the Medtronic 670 G insulin pump at home.  However, afterwards, he was lost to follow-up for 1.5 years. -At today's visit, he does not have a log or meter and he tells me that he still taking NovoLog after meals.  He did increase the dose since last visit, though, and feels that his blood sugars have improved.  Moreover, he stopped drinking soda since last visit.  Unfortunately, he still continues to drink juice.  I strongly advised him to stop all sweet drinks. -Reviewing his blood sugars at home where he is recall, it appears that he is [place] dose is adequate, but blood sugars are still variable and he feels that he may need more NovoLog with meals.  I did not advise him to increase the dose yet since he still has some slightly lower blood sugars at home, but I did advise him to take this 15 minutes before each meal.  Only if sugars do not improve afterwards, to strengthen his insulin to carb ratio. -Upon questioning, he still using the higher target, up to 250.  I again advised him to decrease this to 150. -Refilled all his prescription -At today's visit, we discussed about returning only when he needs refills and I advised him that if he misses another appointment, I will need to discharge him from the practice.  He agrees to come to the appointments as advised. - I advised him to: Patient Instructions  Please stop ALL sweet drinks.  Please continue: - Tresiba 24 units daily  Change: ICR 1:15 (try 1:12) - inject 15 min before meals  ISF 1:50 Target: 250 >> 150  - we checked his HbA1c: 10.4% (lower) - advised to check sugars at different times of the day - 3-4x a day, rotating check times - advised for yearly eye exams >> he is UTD - will check annual labs today - return to clinic in  3 months    2. HTG -He has very high triglycerides, >1400 >> we discussed about  dietary changes: reduce concentrated sweets, and dietary fats, and also started flax oil 1000 mg twice a day.  He was taking this inconsistently before, but now he tells me that he is off for the last 6 months at least. -Reviewed latest lipid panel: Lab Results  Component Value Date   CHOL 434 (H) 06/18/2017   HDL 31.50 (L) 06/18/2017   LDLCALC 73 12/22/2013   LDLDIRECT 105.0 06/18/2017   TRIG (H) 06/18/2017    1469.0 Triglyceride is over 400; calculations on Lipids are invalid.   CHOLHDL 14 06/18/2017  -We will repeat this today   Philemon Kingdom, MD PhD [facility] Endocrinology

## 2020-10-17 ENCOUNTER — Ambulatory Visit: Payer: BLUE CROSS/BLUE SHIELD | Admitting: Internal Medicine

## 2020-10-17 NOTE — Progress Notes (Deleted)
Patient ID: Phillip [name], male   DOB: 1996-01-11, 25 y.o.   MRN: [id number]   This visit occurred during the SARS-CoV-2 public health emergency.  Safety protocols were in place, including screening questions prior to the visit, additional usage of staff PPE, and extensive cleaning of exam room while observing appropriate contact time as indicated for disinfecting solutions.   HPI: Phillip [name] is a 25 y.o.-year-old male, initially referred by his PCP, Dr. Dario Guardian, returning for follow-up for DM1, uncontrolled, with complications (PN).  Last visit 3.5 months ago, previous visit, 1.5 years prior.  In the past, he was very noncompliant with recommended regimen: Taking NovoLog after meals, missing Tresiba doses 1-2 times a week, using expired test strips.  At last visit, he returned after 1 year and 6 months (previous visit 1 year and 1 month prior), he continued to take NovoLog after meals.  At today's visit, ***  Reviewed history: Patient has been diagnosed with diabetes  in 2013; he started insulin at diagnosis.  Reviewed his pediatric endocrinologist's note:  "The patient was admitted to the Pediatric Ward at [facility] on 06/05/11 for the chief complaint of new-onset type 1 diabetes mellitus, dehydration, and weight loss. His initial CBG was 389. Serum sodium was 132, CO2 26, and glucose 394.  His initial hemoglobin A1c was 12.1%. His initial C-peptide was 0.31 (normal 0.89-3.80). TSH was 1.531. Free T4 was 1.07. Urine ketones were 40. Anti-islet cell antibody was elevated at 10 (normal < 5). Anti-insulin antibody and anti-GAD antibody were negative."  Reviewed HbA1c levels: Lab Results  Component Value Date   HGBA1C 10.4 (A) 07/04/2020   HGBA1C 13.2 (A) 01/02/2019   HGBA1C 13.0 (A) 12/03/2017   He is not on insulin pump now but was on the Medtronic 530 G pump from 2014-2017. She developed scar tissue at the infusion site at that time and had to stop the pump, but he is  interested to restart it.  He is on: - Tresiba 20 >> 24 units at bedtime.  He feels better on Tresiba. - NovoLog: (7-12 units per meal) >> 12-16 units AFTER meals ICR 1:15 ISF 1:50 Target: 250 (not using 150, as suggested at last visit)  Meter: Contour  Pt checks his sugars 1-3 times a day: - am: 336 >> 60, 80s, 120, 150-350 >> 140, 150-230, 310 - 2h after b'fast: n/c - before lunch: 68-200, 250 >> 313, 333 >> n/c >> 90-220 - 2h after lunch: n/c - before dinner: n/c >> 292 >> 160-250 (snack) - 2h after dinner: 112, 248-458 >> 60, up to 500 >> 80-200 - bedtime: n/c - nighttime: n/c >> 135 >> see above Lowest sugar was 34 (miscalculated insulin dose)...>> 58, 64 >> 52; he has hypoglycemia awareness in the 70s.   No previous hypoglycemia admission. Highest sugar was 500 >> 360.  He had one DKA admission at diagnosis.  + Hypertriglyceridemia: Lab Results  Component Value Date   CHOL 434 (H) 06/18/2017   HDL 31.50 (L) 06/18/2017   LDLCALC 73 12/22/2013   LDLDIRECT 105.0 06/18/2017   TRIG (H) 06/18/2017    1469.0 Triglyceride is over 400; calculations on Lipids are invalid.   CHOLHDL 14 06/18/2017  He is supposed to be on flax oil 1000 mg twice a day >> taken inconsistently.  - last eye exam was in 10/2019: No DR reportedly  - no numbness and tingling in his feet, but occasional pain  Review latest TSH: Lab Results  Component  Value Date   TSH 0.96 06/18/2017   Pt has FH of DM in father's uncle - type 1, other members with DM2.   ROS: Constitutional: no weight gain/no weight loss, no fatigue, no subjective hyperthermia, no subjective hypothermia Eyes: no blurry vision, no xerophthalmia ENT: no sore throat, no nodules palpated in neck, no dysphagia, no odynophagia, no hoarseness Cardiovascular: no CP/no SOB/no palpitations/no leg swelling Respiratory: no cough/no SOB/no wheezing Gastrointestinal: no N/no V/no D/no C/no acid reflux Musculoskeletal: no muscle aches/no joint  aches Skin: no rashes, no hair loss Neurological: no tremors/no numbness/no tingling/no dizziness  I reviewed pt's medications, allergies, PMH, social hx, family hx, and changes were documented in the history of present illness. Otherwise, unchanged from my initial visit note.  Past Medical History:  Diagnosis Date   ADHD (attention deficit hyperactivity disorder)    Adjustment reaction    Dehydration    Diabetes mellitus type I ([facility])    Goiter    Hypoglycemia    Past Surgical History:  Procedure Laterality Date   TYMPANOSTOMY TUBE PLACEMENT     WRIST FRACTURE SURGERY     Social History   Socioeconomic History   Marital status: Single    Spouse name: Not on file   Number of children: Not on file   Years of education: Not on file   Highest education level: Not on file  Occupational History   Not on file  Tobacco Use   Smoking status: Former    Pack years: 0.00    Types: Cigarettes    Start date: 05/10/2014    Quit date: 10/09/2015    Years since quitting: 5.0   Smokeless tobacco: Never  Substance and Sexual Activity   Alcohol use: Yes    Alcohol/week: 3.0 standard drinks    Types: 3 Cans of beer per week    Comment: social   Drug use: Yes    Frequency: 1.0 times per week    Types: Marijuana    Comment: social   Sexual activity: Never  Other Topics Concern   Not on file  Social History Narrative   Not on file   Social Determinants of Health   Financial Resource Strain: Not on file  Food Insecurity: Not on file  Transportation Needs: Not on file  Physical Activity: Not on file  Stress: Not on file  Social Connections: Not on file  Intimate Partner Violence: Not on file   Current Outpatient Medications on File Prior to Visit  Medication Sig Dispense Refill   Glucagon 3 MG/DOSE POWD Place 3 mg into the nose once as needed for up to 1 dose. 1 each 11   glucose blood (CONTOUR NEXT TEST) test strip Use as instructed 4x 300 each 3   insulin degludec (TRESIBA  FLEXTOUCH) 200 UNIT/ML FlexTouch Pen INJECT 24 UNITS UNDER THE SKIN DAILY 27 mL 1   Insulin Pen Needle 32G X 4 MM MISC Use 4x a day 300 each 3   NOVOLOG FLEXPEN 100 UNIT/ML FlexPen ADMINISTER 12-16 UNITS UNDER THE SKIN THREE TIMES DAILY WITH MEALS 30 mL 1   No current facility-administered medications on file prior to visit.   No Known Allergies Family History  Problem Relation Age of Onset   Diabetes Mother        T2DM   Hypertension Mother    Diabetes Maternal Grandmother        T2DM   Hypertension Maternal Grandmother    Heart disease Maternal Grandmother    Heart disease  Maternal Grandfather    Cancer Paternal Grandmother    Diabetes Other        Granduncle has T1 DM.   Thyroid disease Neg Hx    Obesity Neg Hx    Kidney disease Neg Hx    Anemia Neg Hx     PE: There were no vitals taken for this visit. Wt Readings from Last 3 Encounters:  07/04/20 141 lb 9.6 oz (64.2 kg)  01/02/19 129 lb 3.2 oz (58.6 kg)  12/03/17 128 lb 6.4 oz (58.2 kg)   Constitutional: normal weight, in NAD Eyes: PERRLA, EOMI, no exophthalmos ENT: moist mucous membranes, no thyromegaly, no cervical lymphadenopathy Cardiovascular: tachycardia, RR, No MRG Respiratory: CTA B Gastrointestinal: abdomen soft, NT, ND, BS+ Musculoskeletal: no deformities, strength intact in all 4 Skin: moist, warm, no rashes Neurological: no tremor with outstretched hands, DTR normal in all 4  ASSESSMENT: 1. DM1, uncontrolled, without long term complications, but with hyperglycemia  2. HTG  PLAN:  1. Patient with   longstanding, uncontrolled, type 1 diabetes, previously on slim pump, but now on a basal-bolus insulin regimen after he developed scar tissue at the site of the infusion.  She was interested in restarting the pump in the past but he did not have enough data on his abdomen to accommodate this.  Also, he was noncompliant with appointments.  At last visit he was felt to me that he had the Medtronic 670 G  insulin pump at home.  However, afterwards, he was lost to follow-up for 1.5 years. -At today's visit, he does not have a log or meter and he tells me that he still taking NovoLog after meals.  He did increase the dose since last visit, though, and feels that his blood sugars have improved.  Moreover, he stopped drinking soda since last visit.  Unfortunately, he still continues to drink juice.  I strongly advised him to stop all sweet drinks. -Reviewing his blood sugars at home where he is recall, it appears that he is [place] dose is adequate, but blood sugars are still variable and he feels that he may need more NovoLog with meals.  I did not advise him to increase the dose yet since he still has some slightly lower blood sugars at home, but I did advise him to take this 15 minutes before each meal.  Only if sugars do not improve afterwards, to strengthen his insulin to carb ratio. -Upon questioning, he still using the higher target, up to 250.  I again advised him to decrease this to 150. -Refilled all his prescription -At today's visit, we discussed about returning only when he needs refills and I advised him that if he misses another appointment, I will need to discharge him from the practice.  He agrees to come to the appointments as advised.   - I advised him to: Patient Instructions  Please stop ALL sweet drinks.  Please continue: - Tresiba 24 units daily  Change: ICR 1:15 (try 1:12) - inject 15 min before meals  ISF 1:50 Target: 250 >> 150  - we checked his HbA1c: 7%  - advised to check sugars at different times of the day - 4x a day, rotating check times - advised for yearly eye exams >> he is UTD - return to clinic in 3-4 months     2. HTG -History glycerides are extremely high and we discussed about dietary changes by reducing concentrated sweets and dietary fats +  I advised him to start flax  oil 1000 mg twice a day he is taking this inconsistently.  At last visit, he was  starting that he was off flax oil for at least 6 months. -Reviewed latest lipid panel: Lab Results  Component Value Date   CHOL 434 (H) 06/18/2017   HDL 31.50 (L) 06/18/2017   LDLCALC 73 12/22/2013   LDLDIRECT 105.0 06/18/2017   TRIG (H) 06/18/2017    1469.0 Triglyceride is over 400; calculations on Lipids are invalid.   CHOLHDL 14 06/18/2017  -He did not stop at the lab at last visit, we will repeat a level today   Carlus Pavlov, MD PhD [facility] Endocrinology

## 2021-01-16 ENCOUNTER — Ambulatory Visit: Payer: BLUE CROSS/BLUE SHIELD | Admitting: Internal Medicine

## 2021-03-15 ENCOUNTER — Other Ambulatory Visit: Payer: Self-pay | Admitting: Internal Medicine

## 2022-01-16 ENCOUNTER — Encounter: Payer: Self-pay | Admitting: Internal Medicine

## 2022-01-16 ENCOUNTER — Ambulatory Visit: Payer: BLUE CROSS/BLUE SHIELD | Admitting: Internal Medicine

## 2022-01-16 DIAGNOSIS — E1065 Type 1 diabetes mellitus with hyperglycemia: Secondary | ICD-10-CM

## 2022-01-16 LAB — POCT GLYCOSYLATED HEMOGLOBIN (HGB A1C): Hemoglobin A1C: 8.6 % — AB (ref 4.0–5.6)

## 2022-01-16 MED ORDER — NOVOLOG FLEXPEN 100 UNIT/ML ~~LOC~~ SOPN: PEN_INJECTOR | SUBCUTANEOUS | 1 refills | Status: DC

## 2022-01-16 MED ORDER — INSULIN PEN NEEDLE 32G X 4 MM MISC: 3 refills | Status: AC

## 2022-01-16 MED ORDER — TRESIBA FLEXTOUCH 200 UNIT/ML ~~LOC~~ SOPN: PEN_INJECTOR | SUBCUTANEOUS | 1 refills | Status: DC

## 2022-01-16 MED ORDER — GLUCAGON 3 MG/DOSE NA POWD: 3.0000 mg | Freq: Once | NASAL | 11 refills | Status: DC | PRN

## 2022-01-16 MED ORDER — CONTOUR NEXT TEST VI STRP: ORAL_STRIP | 3 refills | Status: AC

## 2022-01-16 NOTE — Progress Notes (Signed)
Patient ID: Phillip [name], male   DOB: 1995/11/01, 26 y.o.   MRN: [id number]   HPI: Phillip [name] is a 26 y.o.-year-old male, initially referred by his PCP, Dr. Aurther [name], returning for follow-up for DM1, uncontrolled, with complications (PN).  Last visit 1.5 years ago, previous visit 1.5 years prior.  At last visit, he was noncompliant with the recommended regimen and follow-up recommendations.  At that time, we discussed that if he did not follow the recommended visit schedule and only comes in for refills, he should not schedule another appointment.  He now returns 1.5 years after our discussion.  He does mention that his insurance does not cover our visit anymore and he is moving 30 minutes away from [place] and would like to establish care with another provider.  He would need a referral from me.   Patient has been diagnosed with diabetes  in 2013; he started insulin at diagnosis.  Reviewed his pediatric endocrinologist's note:  "The patient was admitted to the Pediatric Ward at [facility] on 06/05/11 for the chief complaint of new-onset type 1 diabetes mellitus, dehydration, and weight loss. His initial CBG was 389. Serum sodium was 132, CO2 26, and glucose 394.  His initial hemoglobin A1c was 12.1%. His initial C-peptide was 0.31 (normal 0.89-3.80). TSH was 1.531. Free T4 was 1.07. Urine ketones were 40. Anti-islet cell antibody was elevated at 10 (normal < 5). Anti-insulin antibody and anti-GAD antibody were negative."  At our visit from 3 years ago: - He was missing [place] doses: Only taking it 1-2x a week - His tests strips were expired since 2017 - He was taking the Novolog after meals  At last visit:  - he returned after 1 year and 6 months (previous visit 1 year and 1 months prior) - Continued to take NovoLog after the meals - Did not lower the target CBG as advised  Reviewed HbA1c levels: Lab Results  Component Value Date   HGBA1C 10.4 (A) 07/04/2020   HGBA1C  13.2 (A) 01/02/2019   HGBA1C 13.0 (A) 12/03/2017   He is not on insulin pump now but was on the Medtronic 530 G pump from 2014-2017. She developed scar tissue at the infusion site at that time and had to stop the pump, but he is interested to restart it.  He is on: - Lantus 45 >> 16 units at bedtime >> Tresiba 20 >> 24 >> 28 units at bedtime.  He feels better on Tresiba. - NovoLog: (7-12 units per meal) >> 12-16 units AFTER >> 8-10 units for lighter meals, 12-16 units for more carb-filled meals (now takes it either before or right after) ICR 1:15 ISF 1:50 Target: 250 (not using 150, as repeatedly suggested)  Meter: Contour  Pt checks his sugars 3 times a day (no log, no meter): - am: 336 >> 60, 80s, 120, 150-350 >> 140, 150-230, 310 >> 80, 110-210, 240 - 2h after b'fast: n/c - before lunch: 68-200, 250 >> 313, 333 >> n/c >> 90-220 >> 150-260 (sweet tea) - 2h after lunch: n/c - before dinner: n/c >> 292 >> 160-250 (snack) >> 120-220 - 2h after dinner: 112, 248-458 >> 60, up to 500 >> 80-200 >> 150-250 - bedtime: n/c - nighttime: n/c >> 135 >> see above Lowest sugar was 34 (miscalculated insulin dose)...>> 58, 64 >> 52 >> 80; he has hypoglycemia awareness in the 70s.   No previous hypoglycemia admission.  At last visit, he had unexpired glucagon kit at  home. Highest sugar was 458 >> 500 >> 360 >> 290.  He had one DKA admission at diagnosis.  + Hypertriglyceridemia: Lab Results  Component Value Date   CHOL 434 (H) 06/18/2017   HDL 31.50 (L) 06/18/2017   LDLCALC 73 12/22/2013   LDLDIRECT 105.0 06/18/2017   TRIG (H) 06/18/2017    1469.0 Triglyceride is over 400; calculations on Lipids are invalid.   CHOLHDL 14 06/18/2017  He is supposed to be on flax oil 1000 mg twice a day >> stopped approximately 6 months at our last visit, previously inconsistently. Still off it now.  - last eye exam was in 2022: No DR reportedly  - no numbness and tingling in his feet, but occasional pain >>  improved since last visit.  Review latest TSH: Lab Results  Component Value Date   TSH 0.96 06/18/2017   Pt has FH of DM in father's uncle - type 1, other members with DM2.   ROS: + see HPI  I reviewed pt's medications, allergies, PMH, social hx, family hx, and changes were documented in the history of present illness. Otherwise, unchanged from my initial visit note.  Past Medical History:  Diagnosis Date   ADHD (attention deficit hyperactivity disorder)    Adjustment reaction    Dehydration    Diabetes mellitus type I ([place])    Goiter    Hypoglycemia    Past Surgical History:  Procedure Laterality Date   TYMPANOSTOMY TUBE PLACEMENT     WRIST FRACTURE SURGERY     Social History   Socioeconomic History   Marital status: Single    Spouse name: Not on file   Number of children: Not on file   Years of education: Not on file   Highest education level: Not on file  Occupational History   Not on file  Tobacco Use   Smoking status: Former    Types: Cigarettes    Start date: 05/10/2014    Quit date: 10/09/2015    Years since quitting: 6.2   Smokeless tobacco: Never  Substance and Sexual Activity   Alcohol use: Yes    Alcohol/week: 3.0 standard drinks of alcohol    Types: 3 Cans of beer per week    Comment: social   Drug use: Yes    Frequency: 1.0 times per week    Types: Marijuana    Comment: social   Sexual activity: Never  Other Topics Concern   Not on file  Social History Narrative   Not on file   Social Determinants of Health   Financial Resource Strain: Not on file  Food Insecurity: Not on file  Transportation Needs: Not on file  Physical Activity: Not on file  Stress: Not on file  Social Connections: Not on file  Intimate Partner Violence: Not on file   Current Outpatient Medications on File Prior to Visit  Medication Sig Dispense Refill   Glucagon 3 MG/DOSE POWD Place 3 mg into the nose once as needed for up to 1 dose. 1 each 11   glucose blood  (CONTOUR NEXT TEST) test strip Use as instructed 4x 300 each 3   insulin degludec (TRESIBA FLEXTOUCH) 200 UNIT/ML FlexTouch Pen INJECT 24 UNITS UNDER THE SKIN DAILY 27 mL 1   Insulin Pen Needle 32G X 4 MM MISC Use 4x a day 300 each 3   NOVOLOG FLEXPEN 100 UNIT/ML FlexPen ADMINISTER 12 TO 16 UNITS UNDER THE SKIN THREE TIMES DAILY WITH MEALS 30 mL 1   No current facility-administered  medications on file prior to visit.   No Known Allergies Family History  Problem Relation Age of Onset   Diabetes Mother        T2DM   Hypertension Mother    Diabetes Maternal Grandmother        T2DM   Hypertension Maternal Grandmother    Heart disease Maternal Grandmother    Heart disease Maternal Grandfather    Cancer Paternal Grandmother    Diabetes Other        Granduncle has T1 DM.   Thyroid disease Neg Hx    Obesity Neg Hx    Kidney disease Neg Hx    Anemia Neg Hx     PE: BP 120/80 (BP Location: Right Arm, Patient Position: Sitting, Cuff Size: Normal)   Pulse 85   Ht 5' 9.5" (1.765 m)   Wt 147 lb 3.2 oz (66.8 kg)   SpO2 99%   BMI 21.43 kg/m  Wt Readings from Last 3 Encounters:  01/16/22 147 lb 3.2 oz (66.8 kg)  07/04/20 141 lb 9.6 oz (64.2 kg)  01/02/19 129 lb 3.2 oz (58.6 kg)   Constitutional: normal weight, in NAD Eyes: EOMI, no exophthalmos ENT: moist mucous membranes, no thyromegaly, no cervical lymphadenopathy Cardiovascular: tachycardia, RR, No MRG Respiratory: CTA B Musculoskeletal: no deformities Skin: moist, warm Neurological: no tremor with outstretched hands  ASSESSMENT: 1. DM1, uncontrolled, without long term complications, but with hyperglycemia  2. HTG  PLAN:  1. Patient with longstanding, uncontrolled, type 1 diabetes, previously on an insulin pump, but now on a basal bolus insulin regimen after he developed scar tissue at the site of the infusion.  He was interested in resuming the pump but he did not have enough fat on the abdomen to accommodate this.  Also, he  was noncompliant with appointments and I was reticent to recommend restarting the pump without proper follow-up.   -At last visit, he returned after 1.5 years without a log or meter and without implementing the changes suggested at the previous visit.  He was also drinking juice and I strongly advised him to stop.  I again recommended to lower his sliding scale correction target and to try a more strict insulin to carb ratio for meals with more carbs.  He was able taking NovoLog after meals. -At today's visit, he returns after another 1.5 years.  He still does not bring a log or meter and he did not lower the NovoLog target.  However, he is occasionally taking NovoLog before meals or right after.  I recommended to always take it before meals, ideally 15 minutes before each meal.  She is not drinking sweet tea which I strongly advised him to stop.  I again  recommended to decrease his sliding scale target 250 to 150. -This is since last appointment as he is moving.  We will not check labs today.  But I did refill his prescriptions. - I advised him to: Patient Instructions  Please stop ALL sweet drinks.  Please continue: - Tresiba 24 units daily - Novolog ICR 1:15 (try 1:12) - inject 15 min before meals  ISF 1:50 Target: 250 >> 150  - we checked his HbA1c: 8.6% (lower) - advised to check sugars at different times of the day - 4x a day, rotating check times - advised for yearly eye exams >> he is UTD - he would establish care with another endocrinologist    2. HTG -Patient with a history very high triglycerides, >1400, taking his flax  oil 1000 mg twice a day inconsistently previously, but off at last visit for at least 6 months.  I advised him to restart this but he did not start. -Reviewed latest lipid panel: LDL above target, HDL low: Lab Results  Component Value Date   CHOL 434 (H) 06/18/2017   HDL 31.50 (L) 06/18/2017   LDLCALC 73 12/22/2013   LDLDIRECT 105.0 06/18/2017   TRIG (H)  06/18/2017    1469.0 Triglyceride is over 400; calculations on Lipids are invalid.   CHOLHDL 14 06/18/2017  -We will have this done at his appointment with his new endocrinologist  Philemon Kingdom, MD PhD [facility] Endocrinology

## 2022-01-16 NOTE — Patient Instructions (Addendum)
Please stop ALL sweet drinks.  Please continue: - Tresiba 28 units daily - NovoLog: ICR 1:15 - inject 15 min before meals  ISF 1:50 Target: 250 >> 150  Let me know to whom I need to refer you.

## 2022-08-31 ENCOUNTER — Other Ambulatory Visit: Payer: Self-pay | Admitting: Internal Medicine

## 2022-10-17 ENCOUNTER — Telehealth: Payer: Self-pay

## 2022-10-17 NOTE — Telephone Encounter (Signed)
Pt called requesting an appt. Last seen 01/16/2022

## 2022-10-22 ENCOUNTER — Encounter: Payer: Self-pay | Admitting: Internal Medicine

## 2022-10-22 ENCOUNTER — Ambulatory Visit (INDEPENDENT_AMBULATORY_CARE_PROVIDER_SITE_OTHER): Payer: BLUE CROSS/BLUE SHIELD | Admitting: Internal Medicine

## 2022-10-22 VITALS — BP 120/72 | HR 75 | Ht 69.5 in | Wt 146.2 lb

## 2022-10-22 DIAGNOSIS — Z794 Long term (current) use of insulin: Secondary | ICD-10-CM | POA: Diagnosis not present

## 2022-10-22 DIAGNOSIS — E1065 Type 1 diabetes mellitus with hyperglycemia: Secondary | ICD-10-CM

## 2022-10-22 DIAGNOSIS — E119 Type 2 diabetes mellitus without complications: Secondary | ICD-10-CM

## 2022-10-22 LAB — GLUCOSE, POCT (MANUAL RESULT ENTRY): POC Glucose: 329 mg/dl — AB (ref 70–99)

## 2022-10-22 LAB — POCT GLYCOSYLATED HEMOGLOBIN (HGB A1C): Hemoglobin A1C: 9.7 % — AB (ref 4.0–5.6)

## 2022-10-22 MED ORDER — FREESTYLE LIBRE 3 SENSOR MISC: 1.0000 | 3 refills | Status: DC

## 2022-10-22 NOTE — Patient Instructions (Signed)
Please stop ALL sweet drinks.  Please continue: - Tresiba 28 units daily - NovoLog: ICR 1:15 - inject 15 min before meals  ISF 1:50 Target: 250 >> 150  Please return in 3-4 months.

## 2022-10-22 NOTE — Progress Notes (Signed)
Patient ID: Phillip [name], male   DOB: 06/16/1995, 27 y.o.   MRN: [id number]   HPI: Phillip [name] is a 27 y.o.-year-old male, initially referred by his PCP, Dr. Dario Guardian, returning for follow-up for DM1, uncontrolled, with complications (PN).  He is not usually compliant with appointments.  Previous visits over 1.5 years apart, last visit 9 months ago.  At that time, he mentioned that he was moving out of town.  At last visit, due to noncompliance with the recommended regimen and follow-up recommendations, we discussed that if he did not follow the recommended visit schedule and only comes in for refills, he should not schedule another appointment.  At that time he told me that he was moving away and needed a referral to another endocrinologist.  However, he moved back and would like to reestablish care with me.  Interim history: No increased urination, blurry vision, nausea, chest pain. 2 weeks ago he fell on a toy - hurt ribs - it is still painful to breathe/sneeze.  Patient has been diagnosed with diabetes  in 2013; he started insulin at diagnosis.  Reviewed his pediatric endocrinologist's note:  "The patient was admitted to the Pediatric Ward at [facility] on 06/05/11 for the chief complaint of new-onset type 1 diabetes mellitus, dehydration, and weight loss. His initial CBG was 389. Serum sodium was 132, CO2 26, and glucose 394.  His initial hemoglobin A1c was 12.1%. His initial C-peptide was 0.31 (normal 0.89-3.80). TSH was 1.531. Free T4 was 1.07. Urine ketones were 40. Anti-islet cell antibody was elevated at 10 (normal < 5). Anti-insulin antibody and anti-GAD antibody were negative."  Reviewed HbA1c levels: Lab Results  Component Value Date   HGBA1C 8.6 (A) 01/16/2022   HGBA1C 10.4 (A) 07/04/2020   HGBA1C 13.2 (A) 01/02/2019   He is not on insulin pump now but was on the Medtronic 530 G pump from 2014-2017. She developed scar tissue at the infusion site at that time and  had to stop the pump, but he is interested to restart it.  He is on: - Lantus 45 >> 16 units at bedtime >> Tresiba 20 >> 24 >> 28 units at bedtime.  He feels better on Tresiba. Forgot many doses - forgot it 4-5x a week.  - NovoLog: (7-12 units per meal) >> 12-16 units AFTER >> 8-10 units for lighter meals, 12-16 units for more carb-filled meals (now takes it either before or right after). Forgot many doses.  ICR 1:15 ISF 1:50 Target: 250 (not using 150, as repeatedly suggested)  Meter: Contour  Pt checks his sugars 3 times a day (no log, no meter): - am:  140, 150-230, 310 >> 80, 110-210, 240 >> 150-250, 300 - 2h after b'fast: n/c - before lunch: 313, 333 >> n/c >> 90-220 >> 150-260 (sweet tea) >> 120-200 - 2h after lunch: n/c - before dinner: n/c >> 292 >> 160-250 (snack) >> 120-220 >> 140-230 - 2h after dinner: 112, 248-458 >> 60, up to 500 >> 80-200 >> 150-250 >> up to 300s - bedtime: n/c - nighttime: n/c >> 135 >> see above Lowest sugar was 34 (miscalculated insulin dose)...>> 58, 64 >> 52 >> 80 >> 62; he has hypoglycemia awareness in the 70s.   No previous hypoglycemia admission.  At last visit, he had unexpired glucagon kit at home. Highest sugar was 458 >> 500 >> 360 >> 290 >> 350.  He had one DKA admission at diagnosis.  + Hypertriglyceridemia: Lab Results  Component Value Date   CHOL 434 (H) 06/18/2017   HDL 31.50 (L) 06/18/2017   LDLCALC 73 12/22/2013   LDLDIRECT 105.0 06/18/2017   TRIG (H) 06/18/2017    1469.0 Triglyceride is over 400; calculations on Lipids are invalid.   CHOLHDL 14 06/18/2017  He is supposed to be on flax oil 1000 mg twice a day >> off - did not restart.  - last eye exam was in 2022: No DR reportedly  - no numbness and tingling in his feet, but occasional pain >> improved since last visit.  Review latest TSH: Lab Results  Component Value Date   TSH 0.96 06/18/2017   Pt has FH of DM in father's uncle - type 1, other members with DM2.    ROS: + see HPI  I reviewed pt's medications, allergies, PMH, social hx, family hx, and changes were documented in the history of present illness. Otherwise, unchanged from my initial visit note.  Past Medical History:  Diagnosis Date   ADHD (attention deficit hyperactivity disorder)    Adjustment reaction    Dehydration    Diabetes mellitus type I ([facility])    Goiter    Hypoglycemia    Past Surgical History:  Procedure Laterality Date   TYMPANOSTOMY TUBE PLACEMENT     WRIST FRACTURE SURGERY     Social History   Socioeconomic History   Marital status: Single    Spouse name: Not on file   Number of children: Not on file   Years of education: Not on file   Highest education level: Not on file  Occupational History   Not on file  Tobacco Use   Smoking status: Former    Types: Cigarettes    Start date: 05/10/2014    Quit date: 10/09/2015    Years since quitting: 7.0   Smokeless tobacco: Never  Substance and Sexual Activity   Alcohol use: Yes    Alcohol/week: 3.0 standard drinks of alcohol    Types: 3 Cans of beer per week    Comment: social   Drug use: Yes    Frequency: 1.0 times per week    Types: Marijuana    Comment: social   Sexual activity: Never  Other Topics Concern   Not on file  Social History Narrative   Not on file   Social Determinants of Health   Financial Resource Strain: Not on file  Food Insecurity: Not on file  Transportation Needs: Not on file  Physical Activity: Not on file  Stress: Not on file  Social Connections: Not on file  Intimate Partner Violence: Not on file   Current Outpatient Medications on File Prior to Visit  Medication Sig Dispense Refill   Glucagon 3 MG/DOSE POWD Place 3 mg into the nose once as needed for up to 1 dose. 1 each 11   glucose blood (CONTOUR NEXT TEST) test strip Use as instructed 4x 300 each 3   insulin degludec (TRESIBA FLEXTOUCH) 200 UNIT/ML FlexTouch Pen INJECT 28 UNITS UNDER THE SKIN DAILY 27 mL 1   Insulin  Pen Needle 32G X 4 MM MISC Use 4x a day 300 each 3   NOVOLOG FLEXPEN 100 UNIT/ML FlexPen ADMINISTER 12 TO 16 UNITS UNDER THE SKIN THREE TIMES DAILY WITH MEALS 30 mL 0   No current facility-administered medications on file prior to visit.   No Known Allergies Family History  Problem Relation Age of Onset   Diabetes Mother        T2DM   Hypertension  Mother    Diabetes Maternal Grandmother        T2DM   Hypertension Maternal Grandmother    Heart disease Maternal Grandmother    Heart disease Maternal Grandfather    Cancer Paternal Grandmother    Diabetes Other        Granduncle has T1 DM.   Thyroid disease Neg Hx    Obesity Neg Hx    Kidney disease Neg Hx    Anemia Neg Hx     PE: BP 120/72   Pulse 75   Ht 5' 9.5" (1.765 m)   Wt 146 lb 3.2 oz (66.3 kg)   SpO2 98%   BMI 21.28 kg/m  Wt Readings from Last 3 Encounters:  10/22/22 146 lb 3.2 oz (66.3 kg)  01/16/22 147 lb 3.2 oz (66.8 kg)  07/04/20 141 lb 9.6 oz (64.2 kg)   Constitutional: normal weight, in NAD Eyes: EOMI, no exophthalmos ENT:  no thyromegaly, no cervical lymphadenopathy Cardiovascular: RRR, No MRG Respiratory: CTA B Musculoskeletal: no deformities Skin: moist, warm Neurological: no tremor with outstretched hands Diabetic Foot Exam - Simple   Simple Foot Form Diabetic Foot exam was performed with the following findings: Yes 10/22/2022  9:46 AM  Visual Inspection No deformities, no ulcerations, no other skin breakdown bilaterally: Yes Sensation Testing Intact to touch and monofilament testing bilaterally: Yes Pulse Check Posterior Tibialis and Dorsalis pulse intact bilaterally: Yes Comments    ASSESSMENT: 1. DM1, uncontrolled, without long term complications, but with hyperglycemia  2. HTG  PLAN:  1. Patient with longstanding, uncontrolled, type 1 diabetes, previously on insulin pump but then on a basal/bolus insulin regimen after he developed scar tissue at the site of insulin infusion.  He was  interested in resuming the pump but did not have enough fat on his abdomen to accommodate this.  Also, he was noncompliant with appointments and I was reticent to recommend restarting the pump without proper follow-up.  At last visit, he returned after another long absence and mentions that he needed a referral to another endocrinologist that she was moving out of town.  He now returns 9 months later. -At last visit, he did not bring a log or meter and did not lower the NovoLog target.  He was occasionally taking NovoLog before meals and right after.  I recommended to always take it before meals, ideally 15 minutes before each meal.  He was drinking sweet tea which I strongly advised him to stop.  I again recommended to decrease his sliding scale target from 250 to 150.  I refilled his prescriptions. -At today's visit, he tells me that he moved back to town and returned to his old job.  The previous job did not work well for him and he missed many insulin doses.  Sugars are higher.  However, he determined to start taking his insulin consistently now.  I also suggested a CGM and asked how this works and where to apply it.  He agrees to try this. -He describes that he is still drinking sweet drinks and I again advised him to stop.  Also, he is still using a target of 250 for his blood sugars and I advised him to decrease the target to 160.  Otherwise, we can continue the current regimen. - I advised him to: Patient Instructions  Please stop ALL sweet drinks.  Please continue: - Tresiba 24 units daily - Novolog ICR 1:15 (try 1:12) - inject 15 min before meals  ISF 1:50 Target: 250 >>  150  - we checked his HbA1c: 9.7% (higher) - advised to check sugars at different times of the day - 4x a day, rotating check times - advised for yearly eye exams >> he is not UTD - return to clinic in 3-4 months    2. HTG -He has a history of very high triglycerides, more than 1400, taking his flax oil 1000 mg BID  inconsistently and then coming off completely.  I advised him to restart this. -Reviewed latest lipid panel from 2019: Triglycerides very high, HDL low: Lab Results  Component Value Date   CHOL 434 (H) 06/18/2017   HDL 31.50 (L) 06/18/2017   LDLCALC 73 12/22/2013   LDLDIRECT 105.0 06/18/2017   TRIG (H) 06/18/2017    1469.0 Triglyceride is over 400; calculations on Lipids are invalid.   CHOLHDL 14 06/18/2017  -Will repeat his  lipid panel today  Orders Placed This Encounter  Procedures   TSH   Lipid panel   Comprehensive metabolic panel   Microalbumin / creatinine urine ratio   POCT glycosylated hemoglobin (Hb A1C)   POCT glucose (manual entry)   He again did not stop at the lab... Reviewing his chart, he has not started the labs since 2020, despite repeated orders Let's:    We absolutely need labs at next visit.  I will need to check them at the beginning of the visit, rather than the end.  Carlus Pavlov, MD PhD [facility] Endocrinology

## 2022-10-29 ENCOUNTER — Telehealth: Payer: Self-pay

## 2022-10-29 ENCOUNTER — Other Ambulatory Visit (HOSPITAL_COMMUNITY): Payer: Self-pay

## 2022-10-29 DIAGNOSIS — E1065 Type 1 diabetes mellitus with hyperglycemia: Secondary | ICD-10-CM

## 2022-10-29 NOTE — Telephone Encounter (Signed)
Patient Advocate Encounter   Received notification from [facility] that prior authorization is required for Phillip [name] 3 sensor  Per test claim: Insurance prefers Dexcom products.   Please advise if change is acceptable

## 2022-10-29 NOTE — Telephone Encounter (Signed)
Ok to send in [place] G7?

## 2022-10-30 ENCOUNTER — Telehealth: Payer: Self-pay

## 2022-10-30 MED ORDER — DEXCOM G7 SENSOR MISC
2 refills | Status: DC
Start: 2022-10-30 — End: 2023-02-20

## 2022-10-30 NOTE — Telephone Encounter (Signed)
Dexcom has been sent.

## 2022-10-30 NOTE — Telephone Encounter (Signed)
Patient Advocate Encounter   Received notification from [facility] that prior authorization is required for Dexcom G7 sensor  Submitted: 10/30/22 Key [id number]  Sent for expedited review Status is pending

## 2022-11-02 NOTE — Telephone Encounter (Signed)
Pharmacy Patient Advocate Encounter  Prior Authorization for Dexcom G7 sensor has been APPROVED   Effective through 04/28/2023

## 2022-12-06 ENCOUNTER — Other Ambulatory Visit: Payer: Self-pay | Admitting: Internal Medicine

## 2022-12-06 ENCOUNTER — Encounter (HOSPITAL_COMMUNITY): Payer: Self-pay | Admitting: Orthopedic Surgery

## 2022-12-06 ENCOUNTER — Other Ambulatory Visit: Payer: Self-pay

## 2022-12-06 NOTE — Progress Notes (Addendum)
Mr. Phillip [name] denies chest pain or shortness of breath.  Patient denies having any s/s of Covid in his household, also denies any known exposure to Covid.  Mr. Phillip [name] denies  any s/s of upper or lower respiratory infections in the past 8 weeks.   Mr. Phillip [name] has type I diabetes, patient states that CBG runs 40-280, the high's are when he forgets to take [place]. Mr. Phillip [name] has a Dexcom G7 continuous blood sugar monitor. I instructed patient tyo take 1/2 of Tresiba dose at bedtime on Sat.   I instructed patient to check CBG after awaking and every 2 hours until arrival  to the hospital. I Instructed patient if CBG is less than 70 to take 1 dose of Glucagon. . Recheck CBG in 15 minutes if CBG is not over 70 call, pre- op desk at [phone] for further instructions. If CBG is greater than 220, take 1/2 dose of Novolog Insulin.  I asked anesthesia PA-C to review.

## 2022-12-08 NOTE — H&P (Signed)
PREOPERATIVE H&P  Chief Complaint: left ankle pain  HPI: Phillip [name] is a 27 y.o. male type I diabetic who is here for evaluation of left ankle injury.  He twisted it while running in the rain.  He had acute onset pain.  He says it feels loose.  He has difficulty walking on it.  Pain is located laterally, not as much medially. Three views, plus a stress view of the left ankle  demonstrate a displaced distal fibula fracture.   He has elected for surgical management.   Past Medical History:  Diagnosis Date   ADHD (attention deficit hyperactivity disorder)    Adjustment reaction    Dehydration    Diabetes mellitus type I (HCC)    Goiter    Headache    Hypoglycemia    Past Surgical History:  Procedure Laterality Date   TYMPANOSTOMY TUBE PLACEMENT     WRIST FRACTURE SURGERY     Social History   Socioeconomic History   Marital status: Single    Spouse name: Not on file   Number of children: Not on file   Years of education: Not on file   Highest education level: Not on file  Occupational History   Not on file  Tobacco Use   Smoking status: Former    Current packs/day: 0.00    Types: Cigarettes    Start date: 05/10/2014    Quit date: 10/09/2015    Years since quitting: 7.1   Smokeless tobacco: Never  Vaping Use   Vaping status: Former   Start date: 05/07/2022   Quit date: 11/05/2022  Substance and Sexual Activity   Alcohol use: Not Currently    Comment: social, 1- 2 times a month   Drug use: Yes    Frequency: 7.0 times per week    Types: Marijuana    Comment: last time today   Sexual activity: Never  Other Topics Concern   Not on file  Social History Narrative   Not on file   Social Determinants of Health   Financial Resource Strain: Not on file  Food Insecurity: Not on file  Transportation Needs: Not on file  Physical Activity: Not on file  Stress: Not on file  Social Connections: Not on file   Family History  Problem Relation Age of Onset   Diabetes Mother         T2DM   Hypertension Mother    Diabetes Maternal Grandmother        T2DM   Hypertension Maternal Grandmother    Heart disease Maternal Grandmother    Heart disease Maternal Grandfather    Cancer Paternal Grandmother    Diabetes Other        Granduncle has T1 DM.   Thyroid disease Neg Hx    Obesity Neg Hx    Kidney disease Neg Hx    Anemia Neg Hx    No Known Allergies Prior to Admission medications   Medication Sig Start Date End Date Taking? Authorizing Provider  acetaminophen (TYLENOL) 500 MG tablet Take 500 mg by mouth every 8 (eight) hours as needed for moderate pain.   Yes [provider]  Glucagon 3 MG/DOSE POWD Place 3 mg into the nose once as needed for up to 1 dose. 01/16/22  Yes Carlus Pavlov, MD  insulin degludec (TRESIBA FLEXTOUCH) 200 UNIT/ML FlexTouch Pen INJECT 28 UNITS UNDER THE SKIN DAILY Patient taking differently: INJECT 28 UNITS UNDER THE SKIN DAILY- HS 01/16/22  Yes Carlus Pavlov, MD  Continuous  Glucose Sensor (DEXCOM G7 SENSOR) MISC Use to check glucose daily. Change every 10 days 10/30/22   Carlus Pavlov, MD  glucose blood (CONTOUR NEXT TEST) test strip Use as instructed 4x 01/16/22   Carlus Pavlov, MD  Insulin Pen Needle 32G X 4 MM MISC Use 4x a day 01/16/22   Carlus Pavlov, MD  NOVOLOG FLEXPEN 100 UNIT/ML FlexPen ADMINISTER 12 TO 16 UNITS UNDER THE SKIN THREE TIMES DAILY WITH MEALS 12/06/22   Carlus Pavlov, MD     Positive ROS: All other systems have been reviewed and were otherwise negative with the exception of those mentioned in the HPI and as above.  Physical Exam: General: Alert, no acute distress Cardiovascular: No pedal edema Respiratory: No cyanosis, no use of accessory musculature GI: No organomegaly, abdomen is soft and non-tender Skin: No lesions in the area of chief complaint Neurologic: Sensation intact distally Psychiatric: Patient is competent for consent with normal mood and affect Lymphatic: No axillary or  cervical lymphadenopathy  MUSCULOSKELETAL: Left ankle has positive ecchymosis and pain to palpation laterally.  Minimal pain medially.  He has crepitus laterally as well.    Three views, plus a stress view of the left ankle demonstrate a displaced distal fibula fracture.  Assessment: Left distal fibula fracture with displacement   Plan: Plan for Procedure(s): OPEN REDUCTION INTERNAL FIXATION (ORIF) ANKLE FRACTURE  The risks benefits and alternatives were discussed with the patient including but not limited to the risks of nonoperative treatment, versus surgical intervention including infection, bleeding, nerve injury,  blood clots, cardiopulmonary complications, morbidity, mortality, among others, and they were willing to proceed.   Armida Sans, PA-C    12/08/2022 10:34 AM

## 2022-12-09 ENCOUNTER — Ambulatory Visit (HOSPITAL_COMMUNITY): Payer: Medicaid Other

## 2022-12-09 ENCOUNTER — Other Ambulatory Visit: Payer: Self-pay

## 2022-12-09 ENCOUNTER — Encounter (HOSPITAL_COMMUNITY)
Admission: RE | Disposition: A | Discharge status: Home / Self Care | Payer: Self-pay | Source: Home / Self Care | Attending: Orthopedic Surgery

## 2022-12-09 ENCOUNTER — Ambulatory Visit (HOSPITAL_COMMUNITY): Payer: BLUE CROSS/BLUE SHIELD | Admitting: Physician Assistant

## 2022-12-09 ENCOUNTER — Encounter (HOSPITAL_COMMUNITY): Payer: Self-pay | Admitting: Orthopedic Surgery

## 2022-12-09 ENCOUNTER — Ambulatory Visit (HOSPITAL_COMMUNITY)
Admission: RE | Admit: 2022-12-09 | Discharge: 2022-12-09 | Disposition: A | Discharge status: Home / Self Care | Payer: BLUE CROSS/BLUE SHIELD | Attending: Orthopedic Surgery | Admitting: Orthopedic Surgery

## 2022-12-09 DIAGNOSIS — W502XXA Accidental twist by another person, initial encounter: Secondary | ICD-10-CM | POA: Diagnosis not present

## 2022-12-09 DIAGNOSIS — S82832A Other fracture of upper and lower end of left fibula, initial encounter for closed fracture: Secondary | ICD-10-CM | POA: Diagnosis not present

## 2022-12-09 DIAGNOSIS — E109 Type 1 diabetes mellitus without complications: Secondary | ICD-10-CM | POA: Diagnosis not present

## 2022-12-09 DIAGNOSIS — Z794 Long term (current) use of insulin: Secondary | ICD-10-CM | POA: Insufficient documentation

## 2022-12-09 DIAGNOSIS — Z87891 Personal history of nicotine dependence: Secondary | ICD-10-CM | POA: Diagnosis not present

## 2022-12-09 HISTORY — DX: Other complications of anesthesia, initial encounter: T88.59XA

## 2022-12-09 HISTORY — PX: ORIF ANKLE FRACTURE: SHX5408

## 2022-12-09 HISTORY — DX: Headache, unspecified: R51.9

## 2022-12-09 LAB — CBC
HCT: 40.6 % (ref 39.0–52.0)
Hemoglobin: 13.5 g/dL (ref 13.0–17.0)
MCH: 29.3 pg (ref 26.0–34.0)
MCHC: 33.3 g/dL (ref 30.0–36.0)
MCV: 88.3 fL (ref 80.0–100.0)
Platelets: 180 10*3/uL (ref 150–400)
RBC: 4.6 MIL/uL (ref 4.22–5.81)
RDW: 12.6 % (ref 11.5–15.5)
WBC: 7 10*3/uL (ref 4.0–10.5)
nRBC: 0 % (ref 0.0–0.2)

## 2022-12-09 LAB — BASIC METABOLIC PANEL WITH GFR
Anion gap: 12 (ref 5–15)
BUN: 13 mg/dL (ref 6–20)
CO2: 22 mmol/L (ref 22–32)
Calcium: 9.2 mg/dL (ref 8.9–10.3)
Chloride: 101 mmol/L (ref 98–111)
Creatinine, Ser: 0.87 mg/dL (ref 0.61–1.24)
GFR, Estimated: >60 mL/min
Glucose, Bld: 290 mg/dL — ABNORMAL HIGH (ref 70–99)
Potassium: 4.1 mmol/L (ref 3.5–5.1)
Sodium: 135 mmol/L (ref 135–145)

## 2022-12-09 LAB — SURGICAL PCR SCREEN
MRSA, PCR: NEGATIVE
Staphylococcus aureus: POSITIVE — AB

## 2022-12-09 LAB — GLUCOSE, CAPILLARY
Glucose-Capillary: 263 mg/dL — ABNORMAL HIGH (ref 70–99)
Glucose-Capillary: 174 mg/dL — ABNORMAL HIGH (ref 70–99)

## 2022-12-09 SURGERY — OPEN REDUCTION INTERNAL FIXATION (ORIF) ANKLE FRACTURE
Anesthesia: General | Site: Ankle | Laterality: Left

## 2022-12-09 MED ORDER — PROPOFOL 10 MG/ML IV BOLUS
INTRAVENOUS | Status: AC
  Filled 2022-12-09: qty 20

## 2022-12-09 MED ORDER — CHLORHEXIDINE GLUCONATE 0.12 % MT SOLN
OROMUCOSAL | Status: AC
  Administered 2022-12-09: 15 mL via OROMUCOSAL
  Filled 2022-12-09: qty 15

## 2022-12-09 MED ORDER — HYDROMORPHONE HCL 1 MG/ML IJ SOLN: 0.2500 mg | INTRAMUSCULAR | Status: DC | PRN

## 2022-12-09 MED ORDER — INSULIN ASPART 100 UNIT/ML IJ SOLN
0.0000 [IU] | INTRAMUSCULAR | Status: DC | PRN
  Administered 2022-12-09: 4 [IU] via SUBCUTANEOUS

## 2022-12-09 MED ORDER — MIDAZOLAM HCL 2 MG/2ML IJ SOLN
INTRAMUSCULAR | Status: AC
  Filled 2022-12-09: qty 2

## 2022-12-09 MED ORDER — MIDAZOLAM HCL 2 MG/2ML IJ SOLN: 2.0000 mg | Freq: Once | INTRAMUSCULAR | Status: AC

## 2022-12-09 MED ORDER — HYDROCODONE-ACETAMINOPHEN 5-325 MG PO TABS: 1.0000 | ORAL_TABLET | Freq: Four times a day (QID) | ORAL | 0 refills | Status: AC | PRN

## 2022-12-09 MED ORDER — DEXAMETHASONE SODIUM PHOSPHATE 10 MG/ML IJ SOLN
INTRAMUSCULAR | Status: DC | PRN
  Administered 2022-12-09: 5 mg via INTRAVENOUS

## 2022-12-09 MED ORDER — PROPOFOL 10 MG/ML IV BOLUS
INTRAVENOUS | Status: DC | PRN
  Administered 2022-12-09: 150 mg via INTRAVENOUS

## 2022-12-09 MED ORDER — ACETAMINOPHEN 500 MG PO TABS: 1000.0000 mg | ORAL_TABLET | Freq: Once | ORAL | Status: AC

## 2022-12-09 MED ORDER — FENTANYL CITRATE (PF) 250 MCG/5ML IJ SOLN
INTRAMUSCULAR | Status: DC | PRN
  Administered 2022-12-09 (×2): 25 ug via INTRAVENOUS

## 2022-12-09 MED ORDER — FENTANYL CITRATE (PF) 250 MCG/5ML IJ SOLN
INTRAMUSCULAR | Status: AC
  Filled 2022-12-09: qty 5

## 2022-12-09 MED ORDER — BACLOFEN 10 MG PO TABS: 10.0000 mg | ORAL_TABLET | Freq: Three times a day (TID) | ORAL | 0 refills | Status: AC

## 2022-12-09 MED ORDER — LIDOCAINE 2% (20 MG/ML) 5 ML SYRINGE
INTRAMUSCULAR | Status: DC | PRN
  Administered 2022-12-09: 40 mg via INTRAVENOUS

## 2022-12-09 MED ORDER — CHLORHEXIDINE GLUCONATE 0.12 % MT SOLN: 15.0000 mL | Freq: Once | OROMUCOSAL | Status: AC

## 2022-12-09 MED ORDER — ORAL CARE MOUTH RINSE: 15.0000 mL | Freq: Once | OROMUCOSAL | Status: AC

## 2022-12-09 MED ORDER — POVIDONE-IODINE 7.5 % EX SOLN
Freq: Once | CUTANEOUS | Status: DC
  Filled 2022-12-09: qty 118

## 2022-12-09 MED ORDER — ONDANSETRON HCL 4 MG/2ML IJ SOLN
INTRAMUSCULAR | Status: DC | PRN
Start: 2022-12-09 — End: 2022-12-09
  Administered 2022-12-09: 4 mg via INTRAVENOUS

## 2022-12-09 MED ORDER — CEFAZOLIN SODIUM-DEXTROSE 2-4 GM/100ML-% IV SOLN
2.0000 g | INTRAVENOUS | Status: AC
  Administered 2022-12-09: 2 g via INTRAVENOUS

## 2022-12-09 MED ORDER — FENTANYL CITRATE (PF) 100 MCG/2ML IJ SOLN: 100.0000 ug | Freq: Once | INTRAMUSCULAR | Status: AC

## 2022-12-09 MED ORDER — FENTANYL CITRATE (PF) 100 MCG/2ML IJ SOLN
INTRAMUSCULAR | Status: AC
  Administered 2022-12-09: 100 ug via INTRAVENOUS
  Filled 2022-12-09: qty 2

## 2022-12-09 MED ORDER — CEFAZOLIN SODIUM-DEXTROSE 2-4 GM/100ML-% IV SOLN
INTRAVENOUS | Status: AC
  Filled 2022-12-09: qty 100

## 2022-12-09 MED ORDER — ONDANSETRON HCL 4 MG PO TABS: 4.0000 mg | ORAL_TABLET | Freq: Three times a day (TID) | ORAL | 0 refills | Status: AC | PRN

## 2022-12-09 MED ORDER — ACETAMINOPHEN 500 MG PO TABS
ORAL_TABLET | ORAL | Status: AC
  Administered 2022-12-09: 1000 mg via ORAL
  Filled 2022-12-09: qty 2

## 2022-12-09 MED ORDER — 0.9 % SODIUM CHLORIDE (POUR BTL) OPTIME
TOPICAL | Status: DC | PRN
  Administered 2022-12-09: 1000 mL

## 2022-12-09 MED ORDER — MIDAZOLAM HCL 2 MG/2ML IJ SOLN
INTRAMUSCULAR | Status: AC
  Administered 2022-12-09: 2 mg via INTRAVENOUS
  Filled 2022-12-09: qty 2

## 2022-12-09 MED ORDER — POVIDONE-IODINE 10 % EX SWAB
2.0000 | Freq: Once | CUTANEOUS | Status: AC
  Administered 2022-12-09: 2 via TOPICAL

## 2022-12-09 MED ORDER — BUPIVACAINE-EPINEPHRINE (PF) 0.5% -1:200000 IJ SOLN
INTRAMUSCULAR | Status: DC | PRN
  Administered 2022-12-09: 30 mL via PERINEURAL

## 2022-12-09 MED ORDER — LACTATED RINGERS IV SOLN: INTRAVENOUS | Status: DC

## 2022-12-09 SURGICAL SUPPLY — 50 items
BIT DRILL 110X2.5XQCK CNCT (BIT) IMPLANT
BIT DRILL 2.5 (BIT) ×1
BIT DRL 110X2.5XQCK CNCT (BIT) ×1
BNDG CMPR STD VLCR NS LF 5.8X4 (GAUZE/BANDAGES/DRESSINGS) ×2
BNDG ELASTIC 4X5.8 VLCR NS LF (GAUZE/BANDAGES/DRESSINGS) IMPLANT
BNDG ELASTIC 4X5.8 VLCR STR LF (GAUZE/BANDAGES/DRESSINGS) ×1 IMPLANT
BNDG ELASTIC 6X5.8 VLCR STR LF (GAUZE/BANDAGES/DRESSINGS) ×1 IMPLANT
CLSR STERI-STRIP ANTIMIC 1/2X4 (GAUZE/BANDAGES/DRESSINGS) IMPLANT
COVER SURGICAL LIGHT HANDLE (MISCELLANEOUS) ×1 IMPLANT
CUFF TOURN SGL QUICK 34 (TOURNIQUET CUFF) ×1
CUFF TRNQT CYL 34X4.125X (TOURNIQUET CUFF) IMPLANT
DRAPE OEC MINIVIEW 54X84 (DRAPES) ×1 IMPLANT
DRAPE U-SHAPE 47X51 STRL (DRAPES) IMPLANT
DURAPREP 26ML APPLICATOR (WOUND CARE) ×1 IMPLANT
ELECT REM PT RETURN 9FT ADLT (ELECTROSURGICAL) ×1
ELECTRODE REM PT RTRN 9FT ADLT (ELECTROSURGICAL) ×1 IMPLANT
GAUZE PAD ABD 8X10 STRL (GAUZE/BANDAGES/DRESSINGS) ×1 IMPLANT
GAUZE SPONGE 4X4 12PLY STRL (GAUZE/BANDAGES/DRESSINGS) ×1 IMPLANT
GAUZE XEROFORM 1X8 LF (GAUZE/BANDAGES/DRESSINGS) ×2 IMPLANT
GLOVE BIO SURGEON STRL SZ7 (GLOVE) ×2 IMPLANT
GLOVE BIOGEL PI IND STRL 7.0 (GLOVE) ×1 IMPLANT
GLOVE ORTHO TXT STRL SZ7.5 (GLOVE) ×1 IMPLANT
GOWN STRL REUS W/ TWL LRG LVL3 (GOWN DISPOSABLE) ×1 IMPLANT
GOWN STRL REUS W/TWL LRG LVL3 (GOWN DISPOSABLE) ×3
KIT BASIN OR (CUSTOM PROCEDURE TRAY) ×1 IMPLANT
KIT TURNOVER KIT B (KITS) ×1 IMPLANT
MANIFOLD NEPTUNE II (INSTRUMENTS) ×1 IMPLANT
NS IRRIG 1000ML POUR BTL (IV SOLUTION) ×1 IMPLANT
PACK ORTHO EXTREMITY (CUSTOM PROCEDURE TRAY) ×1 IMPLANT
PAD ABD 8X10 STRL (GAUZE/BANDAGES/DRESSINGS) IMPLANT
PAD ARMBOARD 7.5X6 YLW CONV (MISCELLANEOUS) ×2 IMPLANT
PADDING CAST ABS COTTON 4X4 ST (CAST SUPPLIES) IMPLANT
PADDING CAST SYNTHETIC 3X4 NS (CAST SUPPLIES) IMPLANT
PLATE 6HOLE 1/3 TUBULAR (Plate) IMPLANT
SCREW CANC 2.5XFT 16X4XST SM (Screw) IMPLANT
SCREW CANC 4.0X16 (Screw) ×2 IMPLANT
SCREW CANCELLOUS FT 4.0X14 (Screw) IMPLANT
SCREW CORT S/T 3.5X22 (Screw) IMPLANT
SCREW CORTICAL 3.5X12 (Screw) IMPLANT
SCREW CORTICAL 3.5X14 (Screw) IMPLANT
SPLINT PLASTER CAST XFAST 5X30 (CAST SUPPLIES) IMPLANT
SUCTION TUBE FRAZIER 10FR DISP (SUCTIONS) ×1 IMPLANT
SUT MNCRL AB 3-0 PS2 18 (SUTURE) ×1 IMPLANT
SUT VIC AB 0 CT1 27 (SUTURE) ×1
SUT VIC AB 0 CT1 27XBRD ANBCTR (SUTURE) ×1 IMPLANT
SUT VIC AB 2-0 CT1 27 (SUTURE) ×1
SUT VIC AB 2-0 CT1 TAPERPNT 27 (SUTURE) IMPLANT
SUT VIC AB 3-0 SH 8-18 (SUTURE) IMPLANT
TOWEL GREEN STERILE (TOWEL DISPOSABLE) ×1 IMPLANT
UNDERPAD 30X36 HEAVY ABSORB (UNDERPADS AND DIAPERS) ×1 IMPLANT

## 2022-12-09 NOTE — Transfer of Care (Signed)
Immediate Anesthesia Transfer of Care Note  Patient: Phillip [name]  Procedure(s) Performed: OPEN REDUCTION INTERNAL FIXATION (ORIF) ANKLE FRACTURE (Left: Ankle)  Patient Location: PACU  Anesthesia Type:General and Regional  Level of Consciousness: awake, alert , and oriented  Airway & Oxygen Therapy: Patient Spontanous Breathing  Post-op Assessment: Report given to RN and Post -op Vital signs reviewed and stable  Post vital signs: Reviewed and stable  Last Vitals:  Vitals Value Taken Time  BP 146/86 12/09/22 0910  Temp    Pulse 95 12/09/22 0911  Resp 21 12/09/22 0911  SpO2 98 % 12/09/22 0911  Vitals shown include unfiled device data.  Last Pain:  Vitals:   12/09/22 1610  TempSrc: Oral  PainSc: 2          Complications: There were no known notable events for this encounter.

## 2022-12-09 NOTE — Op Note (Signed)
12/09/2022  PATIENT:  Phillip [name]    PRE-OPERATIVE DIAGNOSIS: Left distal fibula fracture  POST-OPERATIVE DIAGNOSIS:  Same  PROCEDURE:    1.  Open Reduction Internal Fixation left fibula 2.  3 views plus a stress view of the left ankle  SURGEON:  Eulas Post, MD  PHYSICIAN ASSISTANT: Janine Ores, PA-C, present and scrubbed throughout the case, critical for completion in a timely fashion, and for retraction, instrumentation, and closure.  ANESTHESIA:   General  ESTIMATED BLOOD LOSS: Minimal  UNIQUE ASPECTS OF THE CASE: I had a little bit difficult time getting the correct measurements with the depth gauge, ultimately I exchanged a couple of screws in order to have appropriate position.  The syndesmosis felt stable.  I had anatomic alignment of the distal fibula fracture.  The interfragmentary screw measured at 22, and initially it looked like it did not fully seat although the fixation was quite good, I sunk it a little bit further at the end of the case just to be sure.  The fracture was definitely short and displaced.  PREOPERATIVE INDICATIONS:  Phillip [name] is a  27 y.o. male with a diagnosis of LEFT ANKLE FRACTURE who elected for surgical management to minimize the risk for malunion and nonunion and post-traumatic arthritis.    The risks benefits and alternatives were discussed with the patient preoperatively including but not limited to the risks of infection, bleeding, nerve injury, cardiopulmonary complications, the need for revision surgery, the need for hardware removal, among others, and the patient was willing to proceed.  OPERATIVE IMPLANTS: Biomet / Zimmer 1/3 tubular plate, with a single interfragmentary lag screw.  OPERATIVE PROCEDURE: The patient was brought to the operating room and placed in the supine position. All bony prominences were padded. General anesthesia was administered. The lower extremity was prepped and draped in the usual sterile fashion.   Tourniquet was not utilized.  Time out was performed.   Incision was made over the distal fibula and the fracture was exposed and reduced anatomically with a clamp. A interfragmentary screw was placed. I then applied a 1/3 tubular plate and secured it proximally and distally non-locking screws. Bone quality was reasonably good. I used c-arm to confirm satisfactory reduction and fixation.   The syndesmosis was stressed using live fluoroscopy and found to be stable.   The wounds were irrigated, and closed with vicryl with routine closure for the skin. The wounds were injected with local anesthetic. Sterile gauze was applied followed by a posterior splint. He was awakened and returned to the PACU in stable and satisfactory condition. There were no complications.  3-Views plus a stress view of the left ankle demonstrated appropriate reconstruction of the mortise with internal fixation and appropriate stability of the syndesmosis.

## 2022-12-09 NOTE — Discharge Instructions (Signed)
Diet: As you were doing prior to hospitalization   Shower:  May shower but keep the wounds dry, leave the splint in place and keep the splint dry with a plastic bag.  Dressing:  Just leave the splint in place and we will change your bandages during your first follow-up appointment.    Activity:  Increase activity slowly as tolerated, but follow the weight bearing instructions below.  The rules on driving is that you can not be taking narcotics while you drive, and you must feel in control of the vehicle.    Weight Bearing:   No bearing weight on left leg  To prevent constipation: you may use a stool softener such as -  Colace (over the counter) 100 mg by mouth twice a day  Drink plenty of fluids (prune juice may be helpful) and high fiber foods Miralax (over the counter) for constipation as needed.    Itching:  If you experience itching with your medications, try taking only a single pain pill, or even half a pain pill at a time.  You may take up to 10 pain pills per day, and you can also use benadryl over the counter for itching or also to help with sleep.   Precautions:  If you experience chest pain or shortness of breath - call 911 immediately for transfer to the hospital emergency department!!  If you develop a fever greater that 101 F, purulent drainage from wound, increased redness or drainage from wound, or calf pain -- Call the office at [phone]                                                Follow- Up Appointment:  Please call for an appointment to be seen in 2 weeks [place] - [phone]

## 2022-12-09 NOTE — Anesthesia Procedure Notes (Signed)
Anesthesia Regional Block: Popliteal block   Pre-Anesthetic Checklist: , timeout performed,  Correct Patient, Correct Site, Correct Laterality,  Correct Procedure, Correct Position, site marked,  Risks and benefits discussed,  Pre-op evaluation,  At surgeon's request and post-op pain management  Laterality: Left  Prep: Maximum Sterile Barrier Precautions used, chloraprep       Needles:  Injection technique: Single-shot  Needle Type: Echogenic Stimulator Needle     Needle Length: 9cm  Needle Gauge: 21     Additional Needles:   Procedures:,,,, ultrasound used (permanent image in chart),,    Narrative:  Start time: 12/09/2022 7:14 AM End time: 12/09/2022 7:24 AM Injection made incrementally with aspirations every 5 mL.  Performed by: Personally  Anesthesiologist: Gaynelle Adu, MD

## 2022-12-09 NOTE — Anesthesia Preprocedure Evaluation (Addendum)
Anesthesia Evaluation  Patient identified by MRN, date of birth, ID band Patient awake    Reviewed: Allergy & Precautions, H&P , NPO status , Patient's Chart, lab work & pertinent test results  Airway Mallampati: II  TM Distance: >3 FB Neck ROM: Full    Dental no notable dental hx. (+) Teeth Intact, Dental Advisory Given   Pulmonary former smoker   Pulmonary exam normal breath sounds clear to auscultation       Cardiovascular negative cardio ROS  Rhythm:Regular Rate:Normal     Neuro/Psych  Headaches  Anxiety Depression    negative neurological ROS     GI/Hepatic negative GI ROS, Neg liver ROS,,,  Endo/Other  diabetes, Type 1, Insulin Dependent    Renal/GU negative Renal ROS  negative genitourinary   Musculoskeletal   Abdominal   Peds  (+) ADHD Hematology negative hematology ROS (+)   Anesthesia Other Findings   Reproductive/Obstetrics negative OB ROS                             Anesthesia Physical Anesthesia Plan  ASA: 3  Anesthesia Plan: General   Post-op Pain Management: Regional block*, Ofirmev IV (intra-op)* and Toradol IV (intra-op)*   Induction: Intravenous  PONV Risk Score and Plan: 3 and Ondansetron, Propofol infusion, TIVA and Midazolam  Airway Management Planned: LMA  Additional Equipment:   Intra-op Plan:   Post-operative Plan: Extubation in OR  Informed Consent: I have reviewed the patients History and Physical, chart, labs and discussed the procedure including the risks, benefits and alternatives for the proposed anesthesia with the patient or authorized representative who has indicated his/her understanding and acceptance.     Dental advisory given  Plan Discussed with: CRNA  Anesthesia Plan Comments:        Anesthesia Quick Evaluation

## 2022-12-09 NOTE — Interval H&P Note (Signed)
History and Physical Interval Note:  12/09/2022 7:20 AM  Phillip [name]  has presented today for surgery, with the diagnosis of LEFT ANKLE FRACTURE.  The various methods of treatment have been discussed with the patient and family. After consideration of risks, benefits and other options for treatment, the patient has consented to  Procedure(s): OPEN REDUCTION INTERNAL FIXATION (ORIF) ANKLE FRACTURE (Left) as a surgical intervention.  The patient's history has been reviewed, patient examined, no change in status, stable for surgery.  I have reviewed the patient's chart and labs.  Questions were answered to the patient's satisfaction.     Eulas Post

## 2022-12-09 NOTE — Anesthesia Procedure Notes (Signed)
Procedure Name: LMA Insertion Date/Time: 12/09/2022 7:41 AM  Performed by: Lelon Perla, CRNAPre-anesthesia Checklist: Patient identified, Emergency Drugs available, Suction available and Patient being monitored Patient Re-evaluated:Patient Re-evaluated prior to induction Oxygen Delivery Method: Circle System Utilized Preoxygenation: Pre-oxygenation with 100% oxygen Induction Type: IV induction Ventilation: Mask ventilation without difficulty LMA: LMA inserted LMA Size: 4.0 Number of attempts: 1 Airway Equipment and Method: Bite block Placement Confirmation: positive ETCO2 Tube secured with: Tape Dental Injury: Teeth and Oropharynx as per pre-operative assessment

## 2022-12-09 NOTE — Anesthesia Postprocedure Evaluation (Signed)
Anesthesia Post Note  Patient: Phillip [name]  Procedure(s) Performed: OPEN REDUCTION INTERNAL FIXATION (ORIF) ANKLE FRACTURE (Left: Ankle)     Patient location during evaluation: PACU Anesthesia Type: General and Regional Level of consciousness: awake and alert Pain management: pain level controlled Vital Signs Assessment: post-procedure vital signs reviewed and stable Respiratory status: spontaneous breathing, nonlabored ventilation and respiratory function stable Cardiovascular status: blood pressure returned to baseline and stable Postop Assessment: no apparent nausea or vomiting Anesthetic complications: no  There were no known notable events for this encounter.  Last Vitals:  Vitals:   12/09/22 0945 12/09/22 1000  BP: (!) 142/93 (!) 134/90  Pulse: 71 72  Resp: 11 11  Temp:  36.5 C  SpO2: 99% 99%    Last Pain:  Vitals:   12/09/22 1000  TempSrc:   PainSc: 0-No pain                 Kadasia Kassing,W. EDMOND

## 2022-12-10 ENCOUNTER — Encounter (HOSPITAL_COMMUNITY): Payer: Self-pay | Admitting: Orthopedic Surgery

## 2022-12-10 ENCOUNTER — Telehealth: Payer: Self-pay

## 2022-12-10 NOTE — Telephone Encounter (Signed)
*  Endo  Pharmacy Patient Advocate Encounter   Received notification from CoverMyMeds that prior authorization for Tresiba FlexTouch (insulin degludec injection) 200 Units/mL solution  is required/requested.   Insurance verification completed.   The patient is insured through [facility] .   Per test claim: PA required; PA submitted to [facility] via CoverMyMeds Key/confirmation #/EOC [id number] Status is pending

## 2022-12-11 ENCOUNTER — Other Ambulatory Visit (HOSPITAL_COMMUNITY): Payer: Self-pay

## 2022-12-11 NOTE — Telephone Encounter (Signed)
Pharmacy Patient Advocate Encounter  Received notification from [facility] that Prior Authorization for Phillip [name] has been APPROVED from 11/26/2022 to 12/10/2023

## 2023-01-28 ENCOUNTER — Other Ambulatory Visit: Payer: Self-pay | Admitting: Internal Medicine

## 2023-02-20 ENCOUNTER — Other Ambulatory Visit: Payer: Self-pay

## 2023-02-20 DIAGNOSIS — E1065 Type 1 diabetes mellitus with hyperglycemia: Secondary | ICD-10-CM

## 2023-02-20 MED ORDER — DEXCOM G7 SENSOR MISC
2 refills | Status: DC
Start: 2023-02-20 — End: 2023-07-01

## 2023-02-20 NOTE — Telephone Encounter (Signed)
Patient called requesting a refill on Dexcom G7 sensors.   Refill sent  Requested Prescriptions   Signed Prescriptions Disp Refills   Continuous Glucose Sensor (DEXCOM G7 SENSOR) MISC 3 each 2    Sig: Use to check glucose daily. Change every 10 days    Authorizing Provider: Carlus Pavlov    Ordering User: Pollie Meyer

## 2023-05-13 ENCOUNTER — Other Ambulatory Visit: Payer: Self-pay | Admitting: Internal Medicine

## 2023-05-13 DIAGNOSIS — E1065 Type 1 diabetes mellitus with hyperglycemia: Secondary | ICD-10-CM

## 2023-05-17 ENCOUNTER — Other Ambulatory Visit (HOSPITAL_COMMUNITY): Payer: Self-pay

## 2023-05-17 ENCOUNTER — Telehealth: Payer: Self-pay

## 2023-05-17 NOTE — Telephone Encounter (Signed)
 Pharmacy Patient Advocate Encounter   Received notification from Pt Calls Messages that prior authorization for Dexcom G7 sensor is required/requested.   Insurance verification completed.   The patient is insured through [facility] .   Per test claim: PA required; PA submitted to above mentioned insurance via CoverMyMeds Key/confirmation #/EOC [facility] Status is pending

## 2023-05-17 NOTE — Telephone Encounter (Signed)
Pt needs a PA for Dexcom G7

## 2023-05-27 NOTE — Telephone Encounter (Signed)
Pharmacy Patient Advocate Encounter  Received notification from [facility] that Prior Authorization for Dexcom G7 sensor has been APPROVED through 05/16/2024   PA #/Case ID/Reference #: [id number]

## 2023-06-20 ENCOUNTER — Telehealth: Payer: Self-pay | Admitting: Internal Medicine

## 2023-06-20 NOTE — Telephone Encounter (Signed)
Patient came in to office today and brought in a medical clearance form for new employment.  The form is in Dr. Charlean Sanfilippo folder in the front office.

## 2023-06-25 NOTE — Telephone Encounter (Signed)
Pt needs to be seen per Dr. Elvera Lennox

## 2023-06-25 NOTE — Telephone Encounter (Signed)
Lvm for patient to call back and schedule.

## 2023-06-30 ENCOUNTER — Other Ambulatory Visit: Payer: Self-pay | Admitting: Internal Medicine

## 2023-06-30 DIAGNOSIS — E1065 Type 1 diabetes mellitus with hyperglycemia: Secondary | ICD-10-CM

## 2023-07-01 NOTE — Telephone Encounter (Signed)
 Patient scheduled.

## 2023-07-02 ENCOUNTER — Encounter: Payer: Self-pay | Admitting: Internal Medicine

## 2023-07-02 ENCOUNTER — Ambulatory Visit (INDEPENDENT_AMBULATORY_CARE_PROVIDER_SITE_OTHER): Payer: Medicaid Other | Admitting: Internal Medicine

## 2023-07-02 VITALS — BP 118/68 | HR 64 | Ht 70.0 in | Wt 165.6 lb

## 2023-07-02 DIAGNOSIS — E1065 Type 1 diabetes mellitus with hyperglycemia: Secondary | ICD-10-CM | POA: Diagnosis not present

## 2023-07-02 LAB — POCT GLYCOSYLATED HEMOGLOBIN (HGB A1C): Hemoglobin A1C: 6.3 % — AB (ref 4.0–5.6)

## 2023-07-02 MED ORDER — DEXCOM G7 SENSOR MISC: 3 refills | Status: AC

## 2023-07-02 MED ORDER — GLUCAGON 3 MG/DOSE NA POWD: 3.0000 mg | Freq: Once | NASAL | 11 refills | Status: AC | PRN

## 2023-07-02 MED ORDER — NOVOLOG FLEXPEN 100 UNIT/ML ~~LOC~~ SOPN: 6.0000 [IU] | PEN_INJECTOR | Freq: Three times a day (TID) | SUBCUTANEOUS | Status: AC

## 2023-07-02 NOTE — Progress Notes (Signed)
 Patient ID: TILFORD DEATON, male   DOB: May 02, 1996, 28 y.o.   MRN: [id number]   HPI: Phillip [name] is a 28 y.o.-year-old male, initially referred by his PCP, Dr. Dario Guardian, returning for follow-up for DM1, uncontrolled, with complications (PN).  He is not usually compliant with appointments.  Previous visits for up to 1.5 years apart, last visit 6 months ago.  At today's visit, he presents as he needs a form filled out for work.  Interim history: No increased urination, blurry vision, nausea, chest pain. Since last visit he had an ankle fracture 12/2022 and had to have ORIF. Since last visit, he was able to start the CGM and sugars improved dramatically afterwards.  He also mentions he is more consistent with taking the [place] daily.  Patient has been diagnosed with diabetes  in 2013; he started insulin at diagnosis.  Reviewed his pediatric endocrinologist's note:  "The patient was admitted to the Pediatric Ward at [facility] on 06/05/11 for the chief complaint of new-onset type 1 diabetes mellitus, dehydration, and weight loss. His initial CBG was 389. Serum sodium was 132, CO2 26, and glucose 394.  His initial hemoglobin A1c was 12.1%. His initial C-peptide was 0.31 (normal 0.89-3.80). TSH was 1.531. Free T4 was 1.07. Urine ketones were 40. Anti-islet cell antibody was elevated at 10 (normal < 5). Anti-insulin antibody and anti-GAD antibody were negative."  Reviewed HbA1c levels: Lab Results  Component Value Date   HGBA1C 9.7 (A) 10/22/2022   HGBA1C 8.6 (A) 01/16/2022   HGBA1C 10.4 (A) 07/04/2020   He is not on insulin pump now but was on the Medtronic 530 G pump from 2014-2017. She developed scar tissue at the infusion site at that time and had to stop the pump, but he is interested to restart it.  At last visit he was on: - Lantus 45 >> 16 units at bedtime >> Tresiba 20 >> 24 >> 28 units at bedtime.  He feels better on Tresiba. Forgot many doses - forgot it 4-5x a week.  -  NovoLog: (7-12 units per meal) >> 12-16 units AFTER >> 8-10 units for lighter meals, 12-16 units for more carb-filled meals (now takes it either before or right after). Forgot many doses.  ICR 1:15 ISF 1:50 Target: 250 (not using 150, as repeatedly suggested)  I recommended the following regimen: - Tresiba 24 units daily - now not forgetting it >> however when he increase the dose by himself to 34 units daily - Novolog ICR 1:15  - inject 15 min before meals (usually 6-11 units) ISF 1:50 Target: 250 >> using 200  Meter: Contour  Pt checks his sugars >4x a day:  Prev.: - am:  140, 150-230, 310 >> 80, 110-210, 240 >> 150-250, 300 - 2h after b'fast: n/c - before lunch:  90-220 >> 150-260 (sweet tea) >> 120-200 - 2h after lunch: n/c - before dinner: 160-250 (snack) >> 120-220 >> 140-230 - 2h after dinner: 60-500 >> 80-200 >> 150-250 >> up to 300s - bedtime: n/c - nighttime: n/c >> 135 >> see above Lowest sugar was 34 (miscalculated insulin dose)...>> 52 >> 80 >> 62; he has hypoglycemia awareness in the 70s.   No previous hypoglycemia admission.  At last visit, he had unexpired glucagon kit at home. Highest sugar was 500 >> ...350.  He had one DKA admission at diagnosis.  + Hypertriglyceridemia: Lab Results  Component Value Date   CHOL 434 (H) 06/18/2017   HDL 31.50 (L) 06/18/2017  LDLCALC 73 12/22/2013   LDLDIRECT 105.0 06/18/2017   TRIG (H) 06/18/2017    1469.0 Triglyceride is over 400; calculations on Lipids are invalid.   CHOLHDL 14 06/18/2017  He is supposed to be on flax oil 1000 mg twice a day >> off - did not restart.  - last eye exam was in 2022: No DR reportedly.  - no numbness and tingling in his feet, but occasional pain >> improved.  Last foot exam was here in clinic 10/22/2022.  Review latest TSH: Lab Results  Component Value Date   TSH 0.96 06/18/2017   Pt has FH of DM in father's uncle - type 1, other members with DM2.   Of note, we were not able to get  labs for him for the last several visits since he repeatedly did not stop at the lab:    In 01/2022, due to noncompliance with the recommended regimen and follow-up recommendations, we discussed that if he did not follow the recommended visit schedule and only comes in for refills, he should not schedule another appointment.  At that time he told me that he was moving away and needed a referral to another endocrinologist.  However, he moved back and would like to reestablish care with me.  ROS: + see HPI  I reviewed pt's medications, allergies, PMH, social hx, family hx, and changes were documented in the history of present illness. Otherwise, unchanged from my initial visit note.  Past Medical History:  Diagnosis Date   ADHD (attention deficit hyperactivity disorder)    Adjustment reaction    Complication of anesthesia    Dehydration    Diabetes mellitus type I (HCC)    Goiter    Headache    Hypoglycemia    Past Surgical History:  Procedure Laterality Date   ORIF ANKLE FRACTURE Left 12/09/2022   Procedure: OPEN REDUCTION INTERNAL FIXATION (ORIF) ANKLE FRACTURE;  Surgeon: Teryl Lucy, MD;  Location: [facility] OR;  Service: Orthopedics;  Laterality: Left;   TYMPANOSTOMY TUBE PLACEMENT     WRIST FRACTURE SURGERY     Social History   Socioeconomic History   Marital status: Single    Spouse name: Not on file   Number of children: Not on file   Years of education: Not on file   Highest education level: Not on file  Occupational History   Not on file  Tobacco Use   Smoking status: Former    Current packs/day: 0.00    Types: Cigarettes    Start date: 05/10/2014    Quit date: 10/09/2015    Years since quitting: 7.7   Smokeless tobacco: Never  Vaping Use   Vaping status: Former   Start date: 05/07/2022   Quit date: 11/05/2022  Substance and Sexual Activity   Alcohol use: Not Currently    Comment: social, 1- 2 times a month   Drug use: Yes    Frequency: 7.0 times per week    Types:  Marijuana    Comment: last time today   Sexual activity: Never  Other Topics Concern   Not on file  Social History Narrative   Not on file   Social Drivers of Health   Financial Resource Strain: Not on file  Food Insecurity: Not on file  Transportation Needs: Not on file  Physical Activity: Not on file  Stress: Not on file  Social Connections: Not on file  Intimate Partner Violence: Not on file   Current Outpatient Medications on File Prior to Visit  Medication  Sig Dispense Refill   acetaminophen (TYLENOL) 500 MG tablet Take 500 mg by mouth every 8 (eight) hours as needed for moderate pain.     baclofen (LIORESAL) 10 MG tablet Take 1 tablet (10 mg total) by mouth 3 (three) times daily. As needed for muscle spasm 20 tablet 0   Continuous Glucose Sensor (DEXCOM G7 SENSOR) MISC USE TO CHECK BLOOD GLUCOSE DAILY. CHANGE EVERY 10 DAYS. 3 each 2   Glucagon 3 MG/DOSE POWD Place 3 mg into the nose once as needed for up to 1 dose. 1 each 11   glucose blood (CONTOUR NEXT TEST) test strip Use as instructed 4x 300 each 3   HYDROcodone-acetaminophen (NORCO) 5-325 MG tablet Take 1-2 tablets by mouth every 6 (six) hours as needed for moderate pain or severe pain. MAXIMUM TOTAL ACETAMINOPHEN DOSE IS 4000 MG PER DAY 28 tablet 0   insulin aspart (NOVOLOG FLEXPEN) 100 UNIT/ML FlexPen Inject 12-16 Units into the skin 3 (three) times daily before meals. ADMINISTER 12 TO 16 UNITS UNDER THE SKIN THREE TIMES DAILY WITH MEALS 30 mL 3   Insulin Pen Needle 32G X 4 MM MISC Use 4x a day 300 each 3   ondansetron (ZOFRAN) 4 MG tablet Take 1 tablet (4 mg total) by mouth every 8 (eight) hours as needed for nausea or vomiting. 10 tablet 0   TRESIBA FLEXTOUCH 200 UNIT/ML FlexTouch Pen INJECT 28 UNITS UNDER THE SKIN DAILY 27 mL 1   No current facility-administered medications on file prior to visit.   No Known Allergies Family History  Problem Relation Age of Onset   Diabetes Mother        T2DM   Hypertension  Mother    Diabetes Maternal Grandmother        T2DM   Hypertension Maternal Grandmother    Heart disease Maternal Grandmother    Heart disease Maternal Grandfather    Cancer Paternal Grandmother    Diabetes Other        Granduncle has T1 DM.   Thyroid disease Neg Hx    Obesity Neg Hx    Kidney disease Neg Hx    Anemia Neg Hx     PE: BP 118/68   Pulse 64   Ht 5\' 10"  (1.778 m)   Wt 165 lb 9.6 oz (75.1 kg)   SpO2 99%   BMI 23.76 kg/m  Wt Readings from Last 3 Encounters:  07/02/23 165 lb 9.6 oz (75.1 kg)  12/09/22 145 lb (65.8 kg)  10/22/22 146 lb 3.2 oz (66.3 kg)   Constitutional: normal weight, in NAD Eyes: EOMI, no exophthalmos ENT:  no thyromegaly, no cervical lymphadenopathy Cardiovascular: RRR, No MRG Respiratory: CTA B Musculoskeletal: no deformities Skin: moist, warm Neurological: no tremor with outstretched hands  ASSESSMENT: 1. DM1, uncontrolled, without long term complications, but with hyperglycemia  2. HTG  3.  Noncompliance with the treatment plan  PLAN:  1. Patient with longstanding, uncontrolled, type 1 diabetes, previously on insulin pump but then on a basal/bolus insulin regimen after he developed scar tissue at the site of infusion. He was interested in resuming the pump but did not have enough fat on his abdomen to accommodate this.  Also, he is noncompliant with appointments and I am reticent to recommend restarting the pump without proper follow-up.  At last visit he returned 9 months after the previous visit and then today's visit he comes 6 months later, only as he needed a form for work. -He does not usually bring  the meter or log and he is not compliant with the recommended insulin regimen.  I repeatedly advised him to decrease the target for his sliding scale from 250 to 150.  At last visit, I also recommended to try a stronger insulin to carb ratio and definitely to inject NovoLog 15 minutes before meals.  I also strongly recommended to stop all  sweet drinks.  He was drinking sweet tea. CGM interpretation: -At today's visit, we reviewed his CGM downloads: It appears that 75% of values are in target range (goal >70%), while 6% are higher than 180 (goal <25%), and 19% are lower than 70 (goal <4%).  The calculated average blood sugar is 109.  The projected HbA1c for the next 3 months (GMI) is 5.9%. -Reviewing the CGM trends, sugars appear to be fluctuating mainly within the target range but frequently dropping too low.  Upon questioning, he is using a higher dose of long-acting insulin than recommended and I advised him to decrease this immediately.  Also, he started to use lower doses of mealtime insulin, which we will continue for now.  He decreased the target only from 250-200, which, in the setting of lows, will continue. -I would like to see him in clinic sooner, but he is not usually compliant with coming to the clinic sooner than every 6 months.  I advised him to let me know if he has any more lows. - I advised him to: Patient Instructions  Please reduce: - Tresiba 26-28 units daily  Continue: - Novolog inject 15 min before meals  ICR 1:15  ISF 1:50 Target: 200  Please return in 6 months.  - we checked his HbA1c: 6.3% (much improved) - advised to check sugars at different times of the day - 4x a day, rotating check times - advised for yearly eye exams >> he is UTD - return to clinic in 6 months     2. HTG -He has a history of very high triglycerides, more than 1400, taking his flax oil 1000 mg BID inconsistently and then coming off completely.  I advised him to restart this. -Latest lipid panel was from 2019: Triglycerides very high, HDL low.  At last clinic visit I ordered labs for him but he did not stop in the lab.  At today's visit we checked the labs at the beginning, rather than the end of the visit. Lab Results  Component Value Date   CHOL 434 (H) 06/18/2017   HDL 31.50 (L) 06/18/2017   LDLCALC 73 12/22/2013    LDLDIRECT 105.0 06/18/2017   TRIG (H) 06/18/2017    1469.0 Triglyceride is over 400; calculations on Lipids are invalid.   CHOLHDL 14 06/18/2017  -We repeated his lipid panel today  3.  Noncompliance with the treatment plan and investigation -Please see problem #1 and problem #2  Orders Placed This Encounter  Procedures   Comp Met (CMET)   TSH   Lipid Panel w/reflex Direct LDL   Microalbumin / creatinine urine ratio   POCT glycosylated hemoglobin (Hb A1C)  (He needs to lay down for labs)  Carlus Pavlov, MD PhD [facility] Endocrinology

## 2023-07-02 NOTE — Patient Instructions (Signed)
 Please reduce: - Tresiba 26-28 units daily  Continue: - Novolog inject 15 min before meals  ICR 1:15  ISF 1:50 Target: 200  Please return in 6 months.

## 2023-07-03 ENCOUNTER — Encounter: Payer: Self-pay | Admitting: Internal Medicine

## 2023-07-03 LAB — LIPID PANEL W/REFLEX DIRECT LDL
Cholesterol: 174 mg/dL (ref <200)
HDL: 46 mg/dL (ref >=40)
LDL Cholesterol (Calc): 113 mg/dL — ABNORMAL HIGH
Non-HDL Cholesterol (Calc): 128 mg/dL (ref <130)
Total CHOL/HDL Ratio: 3.8 (calc) (ref <5.0)
Triglycerides: 66 mg/dL (ref <150)

## 2023-07-03 LAB — COMPREHENSIVE METABOLIC PANEL
AG Ratio: 1.5 (calc) (ref 1.0–2.5)
ALT: 20 U/L (ref 9–46)
AST: 18 U/L (ref 10–40)
Albumin: 4.5 g/dL (ref 3.6–5.1)
Alkaline phosphatase (APISO): 69 U/L (ref 36–130)
BUN: 12 mg/dL (ref 7–25)
CO2: 30 mmol/L (ref 20–32)
Calcium: 9.4 mg/dL (ref 8.6–10.3)
Chloride: 104 mmol/L (ref 98–110)
Creat: 0.83 mg/dL (ref 0.60–1.24)
Globulin: 3.1 g/dL (ref 1.9–3.7)
Glucose, Bld: 77 mg/dL (ref 65–99)
Potassium: 4.1 mmol/L (ref 3.5–5.3)
Sodium: 139 mmol/L (ref 135–146)
Total Bilirubin: 0.4 mg/dL (ref 0.2–1.2)
Total Protein: 7.6 g/dL (ref 6.1–8.1)

## 2023-07-03 LAB — MICROALBUMIN / CREATININE URINE RATIO
Creatinine, Urine: 254 mg/dL (ref 20–320)
Microalb Creat Ratio: 4 mg/g{creat} (ref <30)
Microalb, Ur: 1 mg/dL

## 2023-07-03 LAB — TSH: TSH: 2.86 m[IU]/L (ref 0.40–4.50)

## 2023-07-30 ENCOUNTER — Telehealth (HOSPITAL_BASED_OUTPATIENT_CLINIC_OR_DEPARTMENT_OTHER): Payer: Self-pay | Admitting: *Deleted

## 2023-07-30 NOTE — Telephone Encounter (Signed)
 LVM to let pt know appt 4/3 rescheduled to 4/25 at 1050

## 2023-08-08 ENCOUNTER — Ambulatory Visit (HOSPITAL_BASED_OUTPATIENT_CLINIC_OR_DEPARTMENT_OTHER): Payer: BLUE CROSS/BLUE SHIELD | Admitting: Family Medicine

## 2023-08-30 ENCOUNTER — Encounter (HOSPITAL_BASED_OUTPATIENT_CLINIC_OR_DEPARTMENT_OTHER): Payer: Self-pay | Admitting: *Deleted

## 2023-08-30 ENCOUNTER — Ambulatory Visit (HOSPITAL_BASED_OUTPATIENT_CLINIC_OR_DEPARTMENT_OTHER): Admitting: Family Medicine

## 2023-08-30 DIAGNOSIS — L237 Allergic contact dermatitis due to plants, except food: Secondary | ICD-10-CM | POA: Insufficient documentation

## 2023-08-30 HISTORY — DX: Allergic contact dermatitis due to plants, except food: L23.7

## 2023-11-29 ENCOUNTER — Other Ambulatory Visit (HOSPITAL_COMMUNITY): Payer: Self-pay

## 2023-11-29 ENCOUNTER — Telehealth: Payer: Self-pay | Admitting: Pharmacy Technician

## 2023-11-29 NOTE — Telephone Encounter (Addendum)
 Pharmacy Patient Advocate Encounter   Received notification from CoverMyMeds that prior authorization for Tresiba  FlexTouch (insulin  degludec injection) 200 Units/mL solution is due for renewal.   Insurance verification completed.   The patient is insured through [facility] [place].  Action: Medication is now available without a prior authorization.  Key: [id number]

## 2023-12-11 ENCOUNTER — Other Ambulatory Visit (HOSPITAL_COMMUNITY): Payer: Self-pay

## 2023-12-30 ENCOUNTER — Ambulatory Visit (INDEPENDENT_AMBULATORY_CARE_PROVIDER_SITE_OTHER): Payer: Medicaid Other | Admitting: Internal Medicine

## 2023-12-30 ENCOUNTER — Encounter: Payer: Self-pay | Admitting: Internal Medicine

## 2023-12-30 VITALS — BP 122/80 | HR 79 | Ht 70.0 in | Wt 167.4 lb

## 2023-12-30 DIAGNOSIS — E781 Pure hyperglyceridemia: Secondary | ICD-10-CM | POA: Diagnosis not present

## 2023-12-30 DIAGNOSIS — E1065 Type 1 diabetes mellitus with hyperglycemia: Secondary | ICD-10-CM

## 2023-12-30 LAB — POCT GLYCOSYLATED HEMOGLOBIN (HGB A1C): Hemoglobin A1C: 6.4 % — AB (ref 4.0–5.6)

## 2023-12-30 NOTE — Patient Instructions (Addendum)
 Please continue: - Tresiba  28 units daily - Novolog  inject 15 min before meals  ICR 1:15  ISF 1:50 Target: 200  Send me a message when you are almost out of Novolog  to send FiAsp  in.  Please return in 4-6 months (before 07/02/2023).

## 2023-12-30 NOTE — Progress Notes (Signed)
 Patient ID: Phillip [name], male   DOB: 04-27-1996, 28 y.o.   MRN: [id number]   HPI: Phillip [name] is a 28 y.o.-year-old male, initially referred by his PCP, Dr. Brand, returning for follow-up for DM1, uncontrolled, with complications (PN).  He is not usually compliant with appointments.  Previous visits for up to 1.5 years apart, last visit 6 months ago.    Interim history: No increased urination, blurry vision, nausea, chest pain. Since last visit, he has been missing more Tresiba  doses.  Patient has been diagnosed with diabetes  in 2013; he started insulin  at diagnosis.  Reviewed his pediatric endocrinologist's note:  The patient was admitted to the Pediatric Ward at [facility] on 06/05/11 for the chief complaint of new-onset type 1 diabetes mellitus, dehydration, and weight loss. His initial CBG was 389. Serum sodium was 132, CO2 26, and glucose 394.  His initial hemoglobin A1c was 12.1%. His initial C-peptide was 0.31 (normal 0.89-3.80). TSH was 1.531. Free T4 was 1.07. Urine ketones were 40. Anti-islet cell antibody was elevated at 10 (normal < 5). Anti-insulin  antibody and anti-GAD antibody were negative.  Reviewed HbA1c levels: Lab Results  Component Value Date   HGBA1C 6.3 (A) 07/02/2023   HGBA1C 9.7 (A) 10/22/2022   HGBA1C 8.6 (A) 01/16/2022   He is not on insulin  pump now but was on the Medtronic 530 G pump from 2014-2017. She developed scar tissue at the infusion site at that time and had to stop the pump, but he is interested to restart it.  He was previously on: - Lantus  45 >> 16 units at bedtime >> Tresiba  20 >> 24 >> 28 units at bedtime.  He feels better on Tresiba . Forgot many doses - forgot it 4-5x a week.  - NovoLog : (7-12 units per meal) >> 12-16 units AFTER >> 8-10 units for lighter meals, 12-16 units for more carb-filled meals (now takes it either before or right after). Forgot many doses.  ICR 1:15 ISF 1:50 Target: 250 (not using 150, as repeatedly  suggested)  I recommended the following regimen: - Tresiba  24 >> increased by himself to 34 units daily >> advised him to decrease to 28 units daily - Novolog  ICR 1:15  - inject 15 min before meals (usually 6-11 units) ISF 1:50 Target: 250 >> using 200  Meter: Contour  Pt checks his sugars >4x a day:   Previously:  Prev.: - am:  140, 150-230, 310 >> 80, 110-210, 240 >> 150-250, 300 - 2h after b'fast: n/c - before lunch:  90-220 >> 150-260 (sweet tea) >> 120-200 - 2h after lunch: n/c - before dinner: 160-250 (snack) >> 120-220 >> 140-230 - 2h after dinner: 60-500 >> 80-200 >> 150-250 >> up to 300s - bedtime: n/c - nighttime: n/c >> 135 >> see above Lowest sugar was 34 (miscalculated insulin  dose)...>> 52 >> 80 >> 62 >> 45-50; he has hypoglycemia awareness in the 70s.   No previous hypoglycemia admission.  At last visit, he had unexpired glucagon  kit at home. Highest sugar was 500 >> .SABRA.350 >> 315.  He had one DKA admission at diagnosis.  No CKD: Lab Results  Component Value Date   BUN 12 07/02/2023   CREATININE 0.83 07/02/2023   Lab Results  Component Value Date   MICRALBCREAT 4 07/02/2023   MICRALBCREAT 2.9 01/19/2014   MICRALBCREAT 2.9 10/23/2012  He is not on ACE inhibitor/ARB.  + Hypertriglyceridemia: Lab Results  Component Value Date   CHOL 174 07/02/2023  HDL 46 07/02/2023   LDLCALC 113 (H) 07/02/2023   LDLDIRECT 105.0 06/18/2017   TRIG 66 07/02/2023   CHOLHDL 3.8 07/02/2023  He is supposed to be on flax oil 1000 mg twice a day >> off - did not restart.  - last eye exam was in 2022: No DR reportedly.  - no numbness and tingling in his feet, but occasional pain >> improved.  Last foot exam was here in clinic 07/02/2023.  Review latest TSH: Lab Results  Component Value Date   TSH 2.86 07/02/2023   Pt has FH of DM in father's uncle - type 1, other members with DM2.   He had an ankle fracture 12/2022 and had to have ORIF.  In 01/2022, due to  noncompliance with the recommended regimen and follow-up recommendations, we discussed that if he did not follow the recommended visit schedule and only comes in for refills, he should not schedule another appointment.  At that time he told me that he was moving away and needed a referral to another endocrinologist.  However, he moved back and would like to reestablish care with me.  ROS: + see HPI  I reviewed pt's medications, allergies, PMH, social hx, family hx, and changes were documented in the history of present illness. Otherwise, unchanged from my initial visit note.  Past Medical History:  Diagnosis Date   ADHD (attention deficit hyperactivity disorder)    Adjustment reaction    Complication of anesthesia    Dehydration    Diabetes mellitus type I (HCC)    Goiter    Headache    Hypoglycemia    Poison ivy 08/30/2023   Past Surgical History:  Procedure Laterality Date   ORIF ANKLE FRACTURE Left 12/09/2022   Procedure: OPEN REDUCTION INTERNAL FIXATION (ORIF) ANKLE FRACTURE;  Surgeon: Josefina Chew, MD;  Location: [facility] OR;  Service: Orthopedics;  Laterality: Left;   TYMPANOSTOMY TUBE PLACEMENT     WRIST FRACTURE SURGERY     Social History   Socioeconomic History   Marital status: Single    Spouse name: Not on file   Number of children: Not on file   Years of education: Not on file   Highest education level: Not on file  Occupational History   Not on file  Tobacco Use   Smoking status: Former    Current packs/day: 0.00    Types: Cigarettes    Start date: 05/10/2014    Quit date: 10/09/2015    Years since quitting: 8.2   Smokeless tobacco: Never  Vaping Use   Vaping status: Former   Start date: 05/07/2022   Quit date: 11/05/2022  Substance and Sexual Activity   Alcohol use: Not Currently    Comment: social, 1- 2 times a month   Drug use: Yes    Frequency: 7.0 times per week    Types: Marijuana    Comment: last time today   Sexual activity: Never  Other Topics Concern    Not on file  Social History Narrative   Not on file   Social Drivers of Health   Financial Resource Strain: Not on file  Food Insecurity: Not on file  Transportation Needs: Not on file  Physical Activity: Not on file  Stress: Not on file  Social Connections: Not on file  Intimate Partner Violence: Not on file   Current Outpatient Medications on File Prior to Visit  Medication Sig Dispense Refill   acetaminophen  (TYLENOL ) 500 MG tablet Take 500 mg by mouth every 8 (eight)  hours as needed for moderate pain.     baclofen  (LIORESAL ) 10 MG tablet Take 1 tablet (10 mg total) by mouth 3 (three) times daily. As needed for muscle spasm 20 tablet 0   Continuous Glucose Sensor (DEXCOM G7 SENSOR) MISC USE TO CHECK BLOOD GLUCOSE DAILY. CHANGE EVERY 10 DAYS. 9 each 3   Glucagon  3 MG/DOSE POWD Place 3 mg into the nose once as needed for up to 1 dose. 1 each 11   glucose blood (CONTOUR NEXT TEST) test strip Use as instructed 4x 300 each 3   HYDROcodone -acetaminophen  (NORCO) 5-325 MG tablet Take 1-2 tablets by mouth every 6 (six) hours as needed for moderate pain or severe pain. MAXIMUM TOTAL ACETAMINOPHEN  DOSE IS 4000 MG PER DAY 28 tablet 0   insulin  aspart (NOVOLOG  FLEXPEN) 100 UNIT/ML FlexPen Inject 6-12 Units into the skin 3 (three) times daily before meals.     Insulin  Pen Needle 32G X 4 MM MISC Use 4x a day 300 each 3   ondansetron  (ZOFRAN ) 4 MG tablet Take 1 tablet (4 mg total) by mouth every 8 (eight) hours as needed for nausea or vomiting. 10 tablet 0   TRESIBA  FLEXTOUCH 200 UNIT/ML FlexTouch Pen INJECT 28 UNITS UNDER THE SKIN DAILY 27 mL 1   No current facility-administered medications on file prior to visit.   No Known Allergies Family History  Problem Relation Age of Onset   Diabetes Mother        T2DM   Hypertension Mother    Diabetes Maternal Grandmother        T2DM   Hypertension Maternal Grandmother    Heart disease Maternal Grandmother    Heart disease Maternal Grandfather     Cancer Paternal Grandmother    Diabetes Other        Granduncle has T1 DM.   Thyroid  disease Neg Hx    Obesity Neg Hx    Kidney disease Neg Hx    Anemia Neg Hx     PE: BP 122/80   Pulse 79   Ht 5' 10 (1.778 m)   Wt 167 lb 6.4 oz (75.9 kg)   SpO2 99%   BMI 24.02 kg/m  Wt Readings from Last 3 Encounters:  12/30/23 167 lb 6.4 oz (75.9 kg)  07/02/23 165 lb 9.6 oz (75.1 kg)  12/09/22 145 lb (65.8 kg)   Constitutional: normal weight, in NAD Eyes: EOMI, no exophthalmos ENT:  no thyromegaly, no cervical lymphadenopathy Cardiovascular: RRR, No MRG Respiratory: CTA B Musculoskeletal: no deformities Skin: moist, warm Neurological: no tremor with outstretched hands  ASSESSMENT: 1. DM1, uncontrolled, without long term complications, but with hyperglycemia  2. HTG  3.  Noncompliance with the treatment plan  PLAN:  1. Patient with longstanding, previously uncontrolled type 1 diabetes, on the basal-bolus insulin  regimen after he developed scar tissue at the site of insulin  pump infusion.  He was interested in resuming the pump but did not have enough fat on his abdomen to accommodate this reportedly.  Also, he is noncompliant with appointments and I was reticent to recommend restarting the pump without proper follow-up.  At last visit, we discussed about injecting NovoLog  15 minutes before meals and also I strongly recommended to stop all sweet drinks (he was drinking sweet tea).  Reviewing the CGM trends, sugars appears to be fluctuating mainly within the target range but frequently dropping too low.  Upon questioning, he was using a higher dose of long-acting insulin  than recommended and I advised him to  decrease this immediately.  Also, he was using lower doses of mealtime insulin , which we continued at that time.  He was using a higher target, 200, which, in the setting of his lows, we continued. -I advised him to come back in 6 months.  I would like to see him sooner but he is not  usually compliant with earlier appointments.  I did advise him to let me know if he had any more lows. CGM interpretation: -At today's visit, we reviewed his CGM downloads: It appears that 66% of values are in target range (goal >70%), while 22% are higher than 180 (goal <25%), and 12% are lower than 70 (goal <4%).  The calculated average blood sugar is 138.  The projected HbA1c for the next 3 months (GMI) is 6.6%. -Reviewing the CGM trends, sugars appear to be quite fluctuating in the last 5 days, after he attached to the new sensor.  I am not sure if this sensor is very accurate for him.  I recommended to check his fingersticks.  Looking at the previous sensor.,  2 weeks prior, the sugars have been fluctuating mostly within the target range, with only occasional slight hyper and hypoglycemic excursions.  Based on these patterns, I did not recommend a change in regimen at today's visit.  However, we did discuss about possibly switching from NovoLog  to Fiasp  after he runs out of NovoLog .  I advised him to send me a message about this.  This will offer the advantage of being injected at the time of the meal rather than 15 minutes before, with which he still has difficulty.  Also, he has been missing Tresiba  doses lately and we discussed about trying his best to take it every day. - I advised him to: Patient Instructions  Please continue: - Tresiba  28 units daily - Novolog  inject 15 min before meals  ICR 1:15  ISF 1:50 Target: 200  Send me a message when you are almost out of Novolog  to send FiAsp  in.  Please return in 4-6 months (before 07/02/2023).  - we checked his HbA1c: 6.4% (slightly higher) - advised to check sugars at different times of the day - 4x a day, rotating check times - advised for yearly eye exams >> he is not UTD - return to clinic in 4-6 months    2. HTG -He has a history of very high triglycerides, more than 1400, taking his flax oil 1000 mg BID inconsistently and then  coming off completely.  I did advise him to restart this, but he did not. - At last visit, we had to check his lipid panel at the beginning of the visit since he is not usually stopping at the lab at the end of the visit: Lab Results  Component Value Date   CHOL 174 07/02/2023   HDL 46 07/02/2023   LDLCALC 113 (H) 07/02/2023   LDLDIRECT 105.0 06/18/2017   TRIG 66 07/02/2023   CHOLHDL 3.8 07/02/2023  - Discussed about the need to improve diet by reducing fatty foods and increasing fiber  No orders of the defined types were placed in this encounter. (He needs to lay down for labs)  Lela Fendt, MD PhD [facility]

## 2024-04-21 LAB — OPHTHALMOLOGY REPORT-SCANNED

## 2024-07-01 ENCOUNTER — Ambulatory Visit: Admitting: Internal Medicine
